# Patient Record
Sex: Female | Born: 1965 | Race: Black or African American | Hispanic: No | Marital: Married | Smoking: Never smoker
Health system: Southern US, Community
[De-identification: ages and names within clinical notes are randomized; demographics above are authoritative.]

## PROBLEM LIST (undated history)

## (undated) DIAGNOSIS — M5136 Other intervertebral disc degeneration, lumbar region: Secondary | ICD-10-CM

## (undated) DIAGNOSIS — G43909 Migraine, unspecified, not intractable, without status migrainosus: Secondary | ICD-10-CM

## (undated) DIAGNOSIS — M51369 Other intervertebral disc degeneration, lumbar region without mention of lumbar back pain or lower extremity pain: Secondary | ICD-10-CM

## (undated) HISTORY — PX: TONSILLECTOMY: SUR1361

## (undated) HISTORY — PX: GASTRECTOMY: SHX58

## (undated) HISTORY — PX: HERNIA REPAIR: SHX51

---

## 2000-02-27 ENCOUNTER — Inpatient Hospital Stay (HOSPITAL_COMMUNITY): Admission: AD | Admit: 2000-02-27 | Discharge: 2000-03-01 | Payer: Self-pay | Admitting: Obstetrics and Gynecology

## 2000-03-02 ENCOUNTER — Encounter: Admission: RE | Admit: 2000-03-02 | Discharge: 2000-04-23 | Payer: Self-pay | Admitting: Obstetrics and Gynecology

## 2000-04-03 ENCOUNTER — Other Ambulatory Visit: Admission: RE | Admit: 2000-04-03 | Discharge: 2000-04-03 | Payer: Self-pay | Admitting: Obstetrics and Gynecology

## 2001-04-28 ENCOUNTER — Other Ambulatory Visit: Admission: RE | Admit: 2001-04-28 | Discharge: 2001-04-28 | Payer: Self-pay | Admitting: Obstetrics and Gynecology

## 2002-05-06 ENCOUNTER — Other Ambulatory Visit: Admission: RE | Admit: 2002-05-06 | Discharge: 2002-05-06 | Payer: Self-pay | Admitting: Obstetrics and Gynecology

## 2003-06-01 ENCOUNTER — Other Ambulatory Visit: Admission: RE | Admit: 2003-06-01 | Discharge: 2003-06-01 | Payer: Self-pay | Admitting: Obstetrics and Gynecology

## 2013-12-21 DIAGNOSIS — G4733 Obstructive sleep apnea (adult) (pediatric): Secondary | ICD-10-CM | POA: Insufficient documentation

## 2016-05-18 DIAGNOSIS — M6240 Contracture of muscle, unspecified site: Secondary | ICD-10-CM | POA: Insufficient documentation

## 2016-05-18 DIAGNOSIS — M722 Plantar fascial fibromatosis: Secondary | ICD-10-CM | POA: Insufficient documentation

## 2016-07-27 DIAGNOSIS — K219 Gastro-esophageal reflux disease without esophagitis: Secondary | ICD-10-CM | POA: Insufficient documentation

## 2016-07-27 DIAGNOSIS — K581 Irritable bowel syndrome with constipation: Secondary | ICD-10-CM | POA: Insufficient documentation

## 2017-08-14 ENCOUNTER — Encounter: Payer: Self-pay | Admitting: Emergency Medicine

## 2017-08-14 ENCOUNTER — Emergency Department
Admission: EM | Admit: 2017-08-14 | Discharge: 2017-08-14 | Disposition: A | Discharge status: Home / Self Care | Payer: BC Managed Care – PPO | Source: Home / Self Care | Attending: Family Medicine | Admitting: Family Medicine

## 2017-08-14 DIAGNOSIS — M546 Pain in thoracic spine: Secondary | ICD-10-CM

## 2017-08-14 DIAGNOSIS — M545 Low back pain, unspecified: Secondary | ICD-10-CM

## 2017-08-14 HISTORY — DX: Migraine, unspecified, not intractable, without status migrainosus: G43.909

## 2017-08-14 HISTORY — DX: Other intervertebral disc degeneration, lumbar region without mention of lumbar back pain or lower extremity pain: M51.369

## 2017-08-14 HISTORY — DX: Other intervertebral disc degeneration, lumbar region: M51.36

## 2017-08-14 MED ORDER — PREDNISONE 20 MG PO TABS: ORAL_TABLET | ORAL | 0 refills | Status: DC

## 2017-08-14 MED ORDER — METHOCARBAMOL 500 MG PO TABS: 500.0000 mg | ORAL_TABLET | Freq: Two times a day (BID) | ORAL | 0 refills | Status: DC

## 2017-08-14 NOTE — ED Triage Notes (Signed)
Chronic low back pain, DDD, 10 days ago LBP on right, then left now in the center of her back.

## 2017-08-14 NOTE — ED Provider Notes (Signed)
[facility]    CSN: [id number] Arrival date & time: 08/14/17  1857     History   Chief Complaint Chief Complaint  Patient presents with  . Back Pain    HPI Diane Cortez is a 51 y.o. female.   HPI  Diane Cortez is a 51 y.o. female presenting to UC with c/o exacerbation of her chronic low back pain.  She has a hx of DDD but does not usually have issues with her back.  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  Denies specific known injury.  Pain has moved from Right lower back to Left lower back to mid back over the last 10 days.  Pain is aching and sore, 7/10, worse with certain movements.  She has tried OTC icyhot, ibuprofen, warm and cool compresses w/o much relief.  She has been on prednisone and muscle relaxers in the past. Denies fever or chills. No urinary symptoms. Denies change in bowel or bladder habits. No radiation of pain or numbness in arms or legs.     Past Medical History:  Diagnosis Date  . DDD (degenerative disc disease), lumbar   . Migraines     There are no active problems to display for this patient.   Past Surgical History:  Procedure Laterality Date  . HERNIA REPAIR    . TONSILLECTOMY      OB History    No data available       Home Medications    Prior to Admission medications   Medication Sig Start Date End Date Taking? Authorizing Provider  lansoprazole (PREVACID) 30 MG capsule Take 30 mg by mouth daily at 12 noon.   Yes [provider]  Liraglutide -Weight Management (SAXENDA) 18 MG/3ML SOPN Inject into the skin.   Yes [provider]  magnesium sulfate (EPSOM SALT) GRAN Take by mouth as needed for mild constipation.   Yes [provider]  Topiramate ER (QUDEXY XR) 100 MG CS24 sprinkle capsule Take by mouth.   Yes [provider]  methocarbamol (ROBAXIN) 500 MG tablet Take 1 tablet (500 mg total) by mouth 2 (two) times daily. 08/14/17    Lurene Shadow, PA-C  predniSONE (DELTASONE) 20 MG tablet 3 tabs po day one, then 2 po daily x 4 days 08/14/17   Lurene Shadow, PA-C    Family History No family history on file.  Social History Social History  Substance Use Topics  . Smoking status: Never Smoker  . Smokeless tobacco: Never Used  . Alcohol use No     Allergies   Ivp dye [iodinated diagnostic agents]   Review of Systems Review of Systems  Constitutional: Negative for chills and fever.  Gastrointestinal: Negative for abdominal pain, nausea and vomiting.  Genitourinary: Negative for dysuria, flank pain, frequency and hematuria.  Musculoskeletal: Positive for back pain and myalgias. Negative for arthralgias.  Skin: Negative for rash.  Neurological: Negative for weakness and numbness.     Physical Exam Triage Vital Signs ED Triage Vitals  Enc Vitals Group     BP 08/14/17 1915 125/82     Pulse Rate 08/14/17 1915 78     Resp --      Temp 08/14/17 1915 97.8 F (36.6 C)     Temp Source 08/14/17 1915 Oral     SpO2 08/14/17 1915 100 %     Weight 08/14/17 1916 233 lb (105.7 kg)     Height 08/14/17 1916   (1.549 m)     Head Circumference --      Peak Flow --      Pain Score 08/14/17 1916 7     Pain Loc --      Pain Edu? --      Excl. in GC? --    No data found.   Updated Vital Signs BP 125/82 (BP Location: Left Arm)   Pulse 78   Temp 97.8 F (36.6 C) (Oral)   Ht  (1.549 m)   Wt 233 lb (105.7 kg)   SpO2 100%   BMI 44.02 kg/m   Visual Acuity Right Eye Distance:   Left Eye Distance:   Bilateral Distance:    Right Eye Near:   Left Eye Near:    Bilateral Near:     Physical Exam  Constitutional: She is oriented to person, place, and time. She appears well-developed and well-nourished. No distress.  HENT:  Head: Normocephalic and atraumatic.  Eyes: EOM are normal.  Neck: Normal range of motion.  Cardiovascular: Normal rate.   Pulmonary/Chest: Effort normal. No respiratory  distress.  Musculoskeletal: Normal range of motion. She exhibits tenderness. She exhibits no edema.  No midline spinal tenderness. Tenderness to Left and Right paraspinal muscles of mid thoracic to lower lumbar region. Full ROM upper and lower extremities.    Neurological: She is alert and oriented to person, place, and time.  Antalgic gait that slowly improves to normal gait while walking.   Skin: Skin is warm and dry. No rash noted. She is not diaphoretic. No erythema.  Psychiatric: She has a normal mood and affect. Her behavior is normal.  Nursing note and vitals reviewed.    UC Treatments / Results  Labs (all labs ordered are listed, but only abnormal results are displayed) Labs Reviewed - No data to display  EKG  EKG Interpretation None       Radiology No results found.  Procedures Procedures (including critical care time)  Medications Ordered in UC Medications - No data to display   Initial Impression / Assessment and Plan / UC Course  I have reviewed the triage vital signs and the nursing notes.  Pertinent labs & imaging results that were available during my care of the patient were reviewed by me and considered in my medical decision making (see chart for details).     Hx and exam c/w muscle strain in back. No red flag symptoms Will treat conservatively F/u with PCP as needed.   Final Clinical Impressions(s) / UC Diagnoses   Final diagnoses:  Acute midline thoracic back pain  Acute midline low back pain without sciatica    New Prescriptions Discharge Medication List as of 08/14/2017  7:26 PM    START taking these medications   Details  methocarbamol (ROBAXIN) 500 MG tablet Take 1 tablet (500 mg total) by mouth 2 (two) times daily., Starting Wed 08/14/2017, Normal    predniSONE (DELTASONE) 20 MG tablet 3 tabs po day one, then 2 po daily x 4 days, Normal         Controlled Substance Prescriptions [place] Controlled Substance Registry consulted? Not  Applicable   Rolla Plate 08/15/17 1610

## 2017-08-14 NOTE — Discharge Instructions (Signed)
°  You may take 500mg acetaminophen every 4-6 hours or in combination with ibuprofen 400-600mg every 6-8 hours as needed for pain. ° °Robaxin (methocarbamol) is a muscle relaxer and may cause drowsiness. Do not drink alcohol, drive, or operate heavy machinery while taking. ° ° °

## 2017-08-15 ENCOUNTER — Telehealth: Payer: Self-pay | Admitting: *Deleted

## 2017-08-15 NOTE — Telephone Encounter (Signed)
Patient called reporting [facility] did not have any Robaxin in stock. Per patient request I called in Rx to [facility] on [address]

## 2018-12-16 DIAGNOSIS — G43009 Migraine without aura, not intractable, without status migrainosus: Secondary | ICD-10-CM | POA: Insufficient documentation

## 2020-10-08 ENCOUNTER — Emergency Department (INDEPENDENT_AMBULATORY_CARE_PROVIDER_SITE_OTHER): Payer: BC Managed Care – PPO

## 2020-10-08 ENCOUNTER — Encounter: Payer: Self-pay | Admitting: Emergency Medicine

## 2020-10-08 ENCOUNTER — Other Ambulatory Visit: Payer: Self-pay

## 2020-10-08 ENCOUNTER — Emergency Department
Admission: EM | Admit: 2020-10-08 | Discharge: 2020-10-08 | Disposition: A | Discharge status: Home / Self Care | Payer: BC Managed Care – PPO | Source: Home / Self Care

## 2020-10-08 DIAGNOSIS — M542 Cervicalgia: Secondary | ICD-10-CM

## 2020-10-08 DIAGNOSIS — S161XXA Strain of muscle, fascia and tendon at neck level, initial encounter: Secondary | ICD-10-CM

## 2020-10-08 MED ORDER — KETOROLAC TROMETHAMINE 60 MG/2ML IM SOLN
60.0000 mg | Freq: Once | INTRAMUSCULAR | Status: AC
  Administered 2020-10-08: 60 mg via INTRAMUSCULAR

## 2020-10-08 MED ORDER — DEXAMETHASONE SODIUM PHOSPHATE 10 MG/ML IJ SOLN
10.0000 mg | Freq: Once | INTRAMUSCULAR | Status: AC
  Administered 2020-10-08: 10 mg via INTRAMUSCULAR

## 2020-10-08 MED ORDER — METHOCARBAMOL 750 MG PO TABS: 750.0000 mg | ORAL_TABLET | Freq: Three times a day (TID) | ORAL | 0 refills | Status: DC | PRN

## 2020-10-08 MED ORDER — PREDNISONE 20 MG PO TABS: 20.0000 mg | ORAL_TABLET | Freq: Every day | ORAL | 0 refills | Status: DC

## 2020-10-08 NOTE — ED Triage Notes (Signed)
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

## 2020-10-08 NOTE — ED Provider Notes (Signed)
[name]    CSN: [id number] Arrival date & time: 10/08/20  0844      History   Chief Complaint Chief Complaint  Patient presents with  . Neck Pain    HPI Diane Cortez is a 54 y.o. female.   HPI  Patient complains of gradually worsening generalized neck pain with radiation of pain into her left arm causing intermittent numbness and tingling in index finger. She is unable to rotate neck, hyperextend and or flex neck. She endorse bilateral jaw pain with chewing. She is also experiencing a generalized headache. Medical history significant DDD disease. No active cardiovascular disease. Patient denies chest pain or dizziness. No known injury involving the shoulder. Taken Meloxicam, tylenol,and naproxen without relief. She has also applied heat with temporary relief of pain Past Medical History:  Diagnosis Date  . DDD (degenerative disc disease), lumbar   . Migraines     Patient Active Problem List   Diagnosis Date Noted  . Migraine without aura and without status migrainosus, not intractable 12/16/2018  . Gastro-esophageal reflux disease without esophagitis 07/27/2016  . Irritable bowel syndrome with constipation 07/27/2016  . Contracture of tendon sheath 05/18/2016  . Plantar fasciitis 05/18/2016  . OSA (obstructive sleep apnea) 12/21/2013    Past Surgical History:  Procedure Laterality Date  . HERNIA REPAIR    . TONSILLECTOMY      OB History   No obstetric history on file.      Home Medications    Prior to Admission medications   Medication Sig Start Date End Date Taking? Authorizing Provider  dicyclomine (BENTYL) 10 MG capsule Take by mouth. 06/03/20  Yes [provider]  lansoprazole (PREVACID) 30 MG capsule Take 30 mg by mouth daily at 12 noon.   Yes [provider]  magnesium oxide (MAG-OX) 400 MG tablet Take by mouth.   Yes [provider]  meloxicam (MOBIC) 15 MG tablet Take by mouth. 07/18/20 07/18/21 Yes [provider]  almotriptan (AXERT) 12.5 MG tablet Take by mouth. 10/17/17 10/16/20  [provider]  Liraglutide -Weight Management (SAXENDA) 18 MG/3ML SOPN Inject into the skin.    [provider]  magnesium sulfate (EPSOM SALT) GRAN Take by mouth as needed for mild constipation.    [provider]  methocarbamol (ROBAXIN) 500 MG tablet Take 1 tablet (500 mg total) by mouth 2 (two) times daily. Patient not taking: Reported on 10/08/2020 08/14/17   Lurene Shadow, PA-C  predniSONE (DELTASONE) 20 MG tablet 3 tabs po day one, then 2 po daily x 4 days Patient not taking: Reported on 10/08/2020 08/14/17   Lurene Shadow, PA-C  sucralfate (CARAFATE) 1 GM/10ML suspension as needed. 10/16/17   [provider]  Topiramate ER (QUDEXY XR) 100 MG CS24 sprinkle capsule Take by mouth.    [provider]  WEGOVY 2.4 MG/0.75ML SOAJ  09/14/20   [provider]    Family History Family History  Problem Relation Age of Onset  . Diabetes Mother   . Cancer Father   . Diabetes Sister   . Diabetes Brother   . Healthy Sister     Social History Social History   Tobacco Use  . Smoking status: Never Smoker  . Smokeless tobacco: Never Used  Substance Use Topics  . Alcohol use: No  . Drug use: No     Allergies   Hydrocodone-acetaminophen and Ivp dye [iodinated diagnostic agents]   Review of Systems Review of Systems Pertinent negatives listed in  HPI  Physical Exam Triage Vital Signs ED Triage Vitals  Enc Vitals Group     BP 10/08/20 0857 110/80     Pulse Rate 10/08/20 0857 84     Resp 10/08/20 0857 16     Temp 10/08/20 0857 99 F (37.2 C)     Temp Source 10/08/20 0857 Oral     SpO2 10/08/20 0857 99 %     Weight 10/08/20 0903 245 lb (111.1 kg)     Height 10/08/20 0903 5\' 1"  (1.549 m)     Head Circumference --      Peak Flow --      Pain Score 10/08/20 0903 8     Pain Loc --      Pain Edu? --      Excl. in GC? --    No data  found.  Updated Vital Signs BP 110/80 (BP Location: Right Arm)   Pulse 84   Temp 99 F (37.2 C) (Oral)   Resp 16   Ht 5\' 1"  (1.549 m)   Wt 245 lb (111.1 kg)   SpO2 99%   BMI 46.29 kg/m   Visual Acuity Right Eye Distance:   Left Eye Distance:   Bilateral Distance:    Right Eye Near:   Left Eye Near:    Bilateral Near:     Physical Exam General appearance: alert, well developed, well nourished, cooperative and in no distress Head: Normocephalic, without obvious abnormality, atraumatic Respiratory: Respirations even and unlabored, normal respiratory rate Heart: rate and rhythm normal. No gallop or murmurs noted on exam  Cervical neck: Stiffness, tenderness (palpable), decreases ROM, no mass, no spasm. Skin: Skin color, texture, turgor normal. No rashes seen  Psych: Appropriate mood and affect. Neurologic: Mental status: Alert, oriented to person, place, and time,   UC Treatments / Results  Labs (all labs ordered are listed, but only abnormal results are displayed) Labs Reviewed - No data to display  EKG   Radiology No results found.  Procedures Procedures (including critical care time)  Medications Ordered in UC Medications - No data to display  Initial Impression / Assessment and Plan / UC Course  I have reviewed the triage vital signs and the nursing notes.  Pertinent labs & imaging results that were available during my care of the patient were reviewed by me and considered in my medical decision making (see chart for details).    Acute cervical neck strain related to degenerative disc disease confirmed via x-ray here in clinic today.  Will treat with a prednisone taper, muscle relaxers, and continue home meloxicam.  Patient given a Toradol and Decadron injection here in clinic today.  She was start oral prednisone course on tomorrow.  She will follow up with sports medicine if symptoms recur. Final Clinical Impressions(s) / UC Diagnoses   Final diagnoses:   Acute strain of neck muscle, initial encounter     Discharge Instructions     Your x-ray shows chronic neck pain. I am starting you on prednisone taper (start tomorrow 10/09/20)prescribing muscle relaxer and continue Meloxicam.    ED Prescriptions    Medication Sig Dispense Auth. Provider   predniSONE (DELTASONE) 20 MG tablet Take 1 tablet (20 mg total) by mouth daily with breakfast. Prednisone 20 mg, in mornings with breakfast as follows: Take 3 pills for 3 days, take 2 pills for 3 days, and take 1 pill for 3 days. 18 tablet , FNP   methocarbamol (ROBAXIN-750) 750 MG tablet Take 1  tablet (750 mg total) by mouth 3 (three) times daily as needed for muscle spasms. 30 tablet Bing Neighbors, FNP     PDMP not reviewed this encounter.   Bing Neighbors, FNP 10/08/20 1042

## 2020-10-08 NOTE — Discharge Instructions (Addendum)
Your x-ray shows chronic neck pain. I am starting you on prednisone taper (start tomorrow 10/09/20)prescribing muscle relaxer and continue Meloxicam.

## 2020-11-02 ENCOUNTER — Ambulatory Visit: Payer: BC Managed Care – PPO | Admitting: Family Medicine

## 2020-11-03 ENCOUNTER — Ambulatory Visit (INDEPENDENT_AMBULATORY_CARE_PROVIDER_SITE_OTHER): Payer: BC Managed Care – PPO | Admitting: Family Medicine

## 2020-11-03 ENCOUNTER — Other Ambulatory Visit: Payer: Self-pay

## 2020-11-03 VITALS — BP 110/70 | Ht 61.0 in | Wt 244.0 lb

## 2020-11-03 DIAGNOSIS — M62838 Other muscle spasm: Secondary | ICD-10-CM | POA: Insufficient documentation

## 2020-11-03 MED ORDER — BACLOFEN 10 MG PO TABS: 5.0000 mg | ORAL_TABLET | Freq: Two times a day (BID) | ORAL | 1 refills | Status: DC | PRN

## 2020-11-03 NOTE — Assessment & Plan Note (Signed)
Symptoms seem more muscular in nature.  X-ray was reassuring.  Seems less likely for nerve impingement. -Counseled on home exercise therapy and supportive care -Baclofen. -Could consider physical therapy. -Could consider trigger point injections.

## 2020-11-03 NOTE — Progress Notes (Signed)
  Diane Cortez - 54 y.o. female MRN [id number]  Date of birth: 12-27-1965  SUBJECTIVE:  Including CC & ROS.  No chief complaint on file.   Diane Cortez is a 54 y.o. female that is presenting with neck pain.  She did get initial improvement after being seen in the emergency department.  She is having posterior neck pain.  Denies any specific inciting event.  No history of similar pain.  No injury.  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  No radicular symptoms..  Independent review of the cervical spine x-ray from 12/4 shows no acute changes.   Review of Systems See HPI   HISTORY: Past Medical, Surgical, Social, and Family History Reviewed & Updated per EMR.   Pertinent Historical Findings include:  Past Medical History:  Diagnosis Date  . DDD (degenerative disc disease), lumbar   . Migraines     Past Surgical History:  Procedure Laterality Date  . HERNIA REPAIR    . TONSILLECTOMY      Family History  Problem Relation Age of Onset  . Diabetes Mother   . Cancer Father   . Diabetes Sister   . Diabetes Brother   . Healthy Sister     Social History   Socioeconomic History  . Marital status: Married    Spouse name: Not on file  . Number of children: Not on file  . Years of education: Not on file  . Highest education level: Not on file  Occupational History  . Not on file  Tobacco Use  . Smoking status: Never Smoker  . Smokeless tobacco: Never Used  Substance and Sexual Activity  . Alcohol use: No  . Drug use: No  . Sexual activity: Not on file  Other Topics Concern  . Not on file  Social History Narrative  . Not on file   Social Determinants of Health   Financial Resource Strain: Not on file  Food Insecurity: Not on file  Transportation Needs: Not on file  Physical Activity: Not on file  Stress: Not on file  Social Connections: Not on file  Intimate Partner Violence: Not on file     PHYSICAL EXAM:  VS: BP 110/70   Ht 5\' 1"  (1.549 m)   Wt 244 lb  (110.7 kg)   BMI 46.10 kg/m  Physical Exam Gen: NAD, alert, cooperative with exam, well-appearing MSK:  Neck: Limited extension. Normal lateral rotation. Normal flexion. Normal grip strength. Normal pincer grasp. Neurovascularly intact     ASSESSMENT & PLAN:   Neck muscle spasm Symptoms seem more muscular in nature.  X-ray was reassuring.  Seems less likely for nerve impingement. -Counseled on home exercise therapy and supportive care -Baclofen. -Could consider physical therapy. -Could consider trigger point injections.

## 2020-11-03 NOTE — Patient Instructions (Signed)
Nice to meet you Please try heat  Please try the exercises   Please try the baclofen. Do not take with robaxin or flexeril  Let me know about physical therapy  Please send me a message in MyChart with any questions or updates.  Please see me back in 4 weeks.   --Dr. Jordan Likes

## 2020-12-08 ENCOUNTER — Ambulatory Visit: Payer: BC Managed Care – PPO | Admitting: Family Medicine

## 2020-12-08 NOTE — Progress Notes (Deleted)
  Diane Cortez - 55 y.o. female MRN [id number]  Date of birth: 1965/11/27  SUBJECTIVE:  Including CC & ROS.  No chief complaint on file.   Diane Cortez is a 54 y.o. female that is  ***.  ***   Review of Systems See HPI   HISTORY: Past Medical, Surgical, Social, and Family History Reviewed & Updated per EMR.   Pertinent Historical Findings include:  Past Medical History:  Diagnosis Date  . DDD (degenerative disc disease), lumbar   . Migraines     Past Surgical History:  Procedure Laterality Date  . HERNIA REPAIR    . TONSILLECTOMY      Family History  Problem Relation Age of Onset  . Diabetes Mother   . Cancer Father   . Diabetes Sister   . Diabetes Brother   . Healthy Sister     Social History   Socioeconomic History  . Marital status: Married    Spouse name: Not on file  . Number of children: Not on file  . Years of education: Not on file  . Highest education level: Not on file  Occupational History  . Not on file  Tobacco Use  . Smoking status: Never Smoker  . Smokeless tobacco: Never Used  Substance and Sexual Activity  . Alcohol use: No  . Drug use: No  . Sexual activity: Not on file  Other Topics Concern  . Not on file  Social History Narrative  . Not on file   Social Determinants of Health   Financial Resource Strain: Not on file  Food Insecurity: Not on file  Transportation Needs: Not on file  Physical Activity: Not on file  Stress: Not on file  Social Connections: Not on file  Intimate Partner Violence: Not on file     PHYSICAL EXAM:  VS: There were no vitals taken for this visit. Physical Exam Gen: NAD, alert, cooperative with exam, well-appearing MSK:  ***      ASSESSMENT & PLAN:   No problem-specific Assessment & Plan notes found for this encounter.

## 2020-12-09 ENCOUNTER — Telehealth (INDEPENDENT_AMBULATORY_CARE_PROVIDER_SITE_OTHER): Payer: BC Managed Care – PPO | Admitting: Family Medicine

## 2020-12-09 DIAGNOSIS — M62838 Other muscle spasm: Secondary | ICD-10-CM

## 2020-12-09 NOTE — Progress Notes (Signed)
Virtual Visit via Video Note  I connected with Diane Cortez on 12/09/20 at  8:50 AM EST by a video enabled telemedicine application and verified that I am speaking with the correct person using two identifiers.  Location: Patient: home Provider: home   I discussed the limitations of evaluation and management by telemedicine and the availability of in person appointments. The patient expressed understanding and agreed to proceed.  History of Present Illness:  Ms. Diane Cortez is a 55 yo F that is following up for her ongoing neck pain. Has tried baclofen but pain still ongoing. Started taking mobic. She feels significant pain when it flares. Has tried a soft cervical collar as well.    Observations/Objective:  Gen: NAD, alert, cooperative with exam, well-appearing  Assessment and Plan:  Neck muscle spasm:  Still having intermittent ongoing pain. Has some radiation that wraps around to the jaw. No history of teeth grinding or chicken pox.  - counseled on home exercise therapy and supportive care - referral to PT - could consider trigger point injections or further imaging.   Follow Up Instructions:    I discussed the assessment and treatment plan with the patient. The patient was provided an opportunity to ask questions and all were answered. The patient agreed with the plan and demonstrated an understanding of the instructions.   The patient was advised to call back or seek an in-person evaluation if the symptoms worsen or if the condition fails to improve as anticipated.   Diane Gandy, MD

## 2020-12-09 NOTE — Assessment & Plan Note (Signed)
Still having intermittent ongoing pain. Has some radiation that wraps around to the jaw. No history of teeth grinding or chicken pox.  - counseled on home exercise therapy and supportive care - referral to PT - could consider trigger point injections or further imaging.

## 2020-12-19 ENCOUNTER — Other Ambulatory Visit: Payer: Self-pay

## 2020-12-19 ENCOUNTER — Encounter: Payer: Self-pay | Admitting: Physical Therapy

## 2020-12-19 ENCOUNTER — Ambulatory Visit (INDEPENDENT_AMBULATORY_CARE_PROVIDER_SITE_OTHER): Payer: BC Managed Care – PPO | Admitting: Physical Therapy

## 2020-12-19 DIAGNOSIS — M62838 Other muscle spasm: Secondary | ICD-10-CM | POA: Diagnosis not present

## 2020-12-19 DIAGNOSIS — M542 Cervicalgia: Secondary | ICD-10-CM

## 2020-12-19 DIAGNOSIS — M6281 Muscle weakness (generalized): Secondary | ICD-10-CM | POA: Diagnosis not present

## 2020-12-19 DIAGNOSIS — R293 Abnormal posture: Secondary | ICD-10-CM

## 2020-12-19 NOTE — Patient Instructions (Signed)
Access Code: [email or web address] URL: [email or web address] Date: 12/19/2020 Prepared by: Roderic Scarce  Exercises Standing Backward Shoulder Rolls - 7-8 x daily - 5-10 reps Standing Scapular Retraction - 7-8 x daily - 5-10 reps Shoulder External Rotation and Scapular Retraction - 7-8 x daily - 5-10 reps Standing Cervical Retraction - 7-8 x daily - 5-10 reps Doorway Pec Stretch at 60 Elevation - 1 x daily - 3 reps - 30 hold Single Arm Doorway Pec Stretch at 90 Degrees Abduction - 1 x daily - 3 reps - 30 hold

## 2020-12-19 NOTE — Therapy (Signed)
[facility] [place] [address]  [address] [place], [place], [address] Phone: [phone]   Fax:  [phone]  Physical Therapy Evaluation  Patient Details  Name: Diane Cortez MRN: [id number] Date of Birth: February 07, 1966 Referring Provider (PT): Dr Clare Gandy   Encounter Date: 12/19/2020   PT End of Session - 12/19/20 1059    Visit Number 1    Number of Visits 6    Date for PT Re-Evaluation 01/30/21    Authorization Type BCBS    PT Start Time 1059    PT Stop Time 1150    PT Time Calculation (min) 51 min    Activity Tolerance Patient tolerated treatment well    Behavior During Therapy [facility] for tasks assessed/performed           Past Medical History:  Diagnosis Date  . DDD (degenerative disc disease), lumbar   . Migraines     Past Surgical History:  Procedure Laterality Date  . HERNIA REPAIR    . TONSILLECTOMY      There were no vitals filed for this visit.    Subjective Assessment - 12/19/20 1059    Subjective Pt reports she developed neck pain around Thanksgiving, no episode of injury.  She has tried heat, soft coller, pain meds, gels, changing pillows.  Had two injections in Dec, a steriod pack and muscle relaxer at that time.    Pertinent History plantar fascitis in Rt foot, decreased tightness of tensons in ankle - wears a stabilizing brace to put off having surgery. Has a rowing machine and attempts to use this periodically but it has bothered her lower body, thinks the neck and shoulder were ok with it.    Diagnostic tests xray - mild degeneratvie changes C3-4    Currently in Pain? No/denies   no pain today, she has episodes that have occurred several times since Thanksgiving.  She will be in extreme pain for 6-7 days   Pain Radiating Towards into jaw, down into her Rt thumb sometimes and into the Lt foot.  Her pain is random and interferes with her sleep.    Aggravating Factors  unknown    Pain Relieving Factors using  coller, gentle movment.              [facility] PT Assessment - 12/19/20 0001      Assessment   Medical Diagnosis Neck spasms    Referring Provider (PT) Dr Clare Gandy    Onset Date/Surgical Date 10/08/20    Hand Dominance Right    Next MD Visit PRN    Prior Therapy a long time ago for her back      Precautions   Precautions None      Balance Screen   Has the patient fallen in the past 6 months No    Has the patient had a decrease in activity level because of a fear of falling?  No    Is the patient reluctant to leave their home because of a fear of falling?  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 Popping in Lt shoulder with elevation, behind back just below bra strap   Cervical Flexion 28 with pain    Cervical Extension 56 slight pain    Cervical - Right Side Bend 24, pain Rt side of neck    Cervical - Left Side Bend 42    Cervical - Right Rotation 68    Cervical - Left Rotation 52 with pain      Strength   Strength Assessment Site Shoulder;Elbow;Hand    Right/Left Shoulder --   Rt 4/5,Lt 4+/5    Right/Left Elbow --   Rt 4+/5, Lt 5/5   Right/Left hand --   grip Rt 43/45/47, Lt 38/40/38     Flexibility   Soft Tissue Assessment /Muscle Length --   tight pecs bilat     Palpation   Spinal mobility hypomobile through cervical spine and thoracic to T8    Palpation comment very tight and tender in Rt upper trap, levator and pericervical muscles, some tightness in the Lt side however not tender.      Special Tests   Other special tests (-) spurlings                      Objective measurements completed on examination: See above findings.       [facility] Adult PT Treatment/Exercise - 12/19/20 0001      Exercises   Exercises Neck      Neck Exercises: Seated   Neck Retraction 10 reps    Shoulder Rolls Backwards;10 reps    Other Seated Exercise 10 reps scapular retraction arms at sides and into ER      Neck Exercises: Stretches   Chest Stretch 1 rep;30 seconds    Chest Stretch Limitations doorway low and mid                  PT Education - 12/19/20 1302    Education Details HEP, POC and FOTO    Person(s) Educated Patient    Methods Explanation;Demonstration;Handout    Comprehension Returned demonstration;Verbalized understanding               PT Long Term Goals - 12/19/20 1309      PT LONG TERM GOAL #1   Title I with advanced HEP for postural correction work and use of rowing machine at home for general wellness    Time 6    Period Weeks    Status New    Target Date 01/30/21      PT LONG TERM GOAL #2   Title improve FOTO =/better than 66    Time 6    Period Weeks    Status New    Target Date 01/30/21      PT LONG TERM GOAL #3   Title demo painfree cervical ROM WFL to allow her to perform normal acitivites without difficulty    Time 6  Period Weeks    Status New    Target Date 01/30/21      PT LONG TERM GOAL #4   Title improve Rt UE strength to = LT    Time 6    Period Weeks    Status New    Target Date 01/30/21      PT LONG  TERM GOAL #5   Title report no new episodes of pain flare ups for 3 wks or more    Period Weeks    Status New    Target Date 01/30/21                  Plan - 12/19/20 1303    Clinical Impression Statement 55 yo female with intermittent neck pain with radiculopathy into her hands that started in Dec of last year.  Her most frequent episode was 12/09/2020.  She reports she will have flare ups from unknown etiology that last about 6-7 days.  She manages the episodes with medication, wearing a soft collar, heat, massage and gently motion.  At the time of this eval she is painfree at rest.  She does have limited cervical ROM with pain at endrange, weakness in her Rt shoulder and upper back, postural changes, hypomobility throughout her cervical spine and upper to mid thoracic and alot of tighness in the Rt upper shoulder and neck.  She would benefit from PT to address these deficits and educate on exercise to prevent reoccurrence or help her manage through them quicker.    Personal Factors and Comorbidities Comorbidity 3+    Comorbidities refer to snapshot    Examination-Activity Limitations Other    Examination-Participation Restrictions Other    Stability/Clinical Decision Making Stable/Uncomplicated    Clinical Decision Making Low    Rehab Potential Good    PT Frequency 1x / week   per pt request as she has a high co-pay for therapy   PT Duration 6 weeks    PT Treatment/Interventions Iontophoresis 4mg /ml Dexamethasone;Taping;Vasopneumatic Device;Patient/family education;Functional mobility training;Moist Heat;Traction;Therapeutic activities;Passive range of motion;Dry needling;Manual techniques;Neuromuscular re-education;Electrical Stimulation;Cryotherapy    PT Next Visit Plan manual work to Rt upper shoulder/cervical region, possible DN.  Postural correction work and progress HEP as she is only able to attend once a week.  Modalities PRN    PT Home Exercise Plan Access Code:     Consulted and Agree with Plan of Care Patient           Patient will benefit from skilled therapeutic intervention in order to improve the following deficits and impairments:  Pain,Postural dysfunction,Decreased strength,Hypomobility,Increased muscle spasms,Impaired UE functional use,Decreased range of motion  Visit Diagnosis: Cervicalgia - Plan: PT plan of care cert/re-cert  Muscle weakness (generalized) - Plan: PT plan of care cert/re-cert  Abnormal posture - Plan: PT plan of care cert/re-cert  Other muscle spasm - Plan: PT plan of care cert/re-cert     Problem List Patient Active Problem List   Diagnosis Date Noted  . Neck muscle spasm 11/03/2020  . Migraine without aura and without status migrainosus, not intractable 12/16/2018  . Gastro-esophageal reflux disease without esophagitis 07/27/2016  . Irritable bowel syndrome with constipation 07/27/2016  . Contracture of tendon sheath 05/18/2016  . Plantar fasciitis 05/18/2016  . OSA (obstructive sleep apnea) 12/21/2013    12/23/2013 rPT  12/19/2020, 1:14 PM  [facility] [address] [place] [address] [place], [place], [place] Phone: [phone]   Fax:  [phone]  Name: Diane Cortez MRN:  [id number] Date of Birth: April 24, 1966

## 2020-12-22 ENCOUNTER — Ambulatory Visit: Payer: BC Managed Care – PPO | Admitting: Physical Therapy

## 2020-12-28 ENCOUNTER — Other Ambulatory Visit: Payer: Self-pay

## 2020-12-28 ENCOUNTER — Ambulatory Visit: Payer: BC Managed Care – PPO | Admitting: Physical Therapy

## 2020-12-28 DIAGNOSIS — R293 Abnormal posture: Secondary | ICD-10-CM | POA: Diagnosis not present

## 2020-12-28 DIAGNOSIS — M542 Cervicalgia: Secondary | ICD-10-CM

## 2020-12-28 DIAGNOSIS — M6281 Muscle weakness (generalized): Secondary | ICD-10-CM | POA: Diagnosis not present

## 2020-12-28 DIAGNOSIS — M62838 Other muscle spasm: Secondary | ICD-10-CM

## 2020-12-28 NOTE — Patient Instructions (Signed)
Access Code: [email or web address] URL: [email or web address] Date: 12/28/2020 Prepared by: Reggy Eye  Exercises Standing Backward Shoulder Rolls - 7-8 x daily - 5-10 reps Shoulder External Rotation and Scapular Retraction - 7-8 x daily - 5-10 reps Standing Cervical Retraction - 7-8 x daily - 5-10 reps Doorway Pec Stretch at 60 Elevation - 1 x daily - 3 reps - 30 hold Single Arm Doorway Pec Stretch at 90 Degrees Abduction - 1 x daily - 3 reps - 30 hold Standing Shoulder Horizontal Abduction with Resistance - 1 x daily - 7 x weekly - 3 sets - 10 reps Standing Bilateral Low Shoulder Row with Anchored Resistance - 1 x daily - 7 x weekly - 3 sets - 10 reps

## 2020-12-28 NOTE — Therapy (Signed)
[facility] [place] [address] [place] [address] [place], [place], [address] Phone: [phone]   Fax:  [phone]  Physical Therapy Treatment  Patient Details  Name: Diane Cortez MRN: [id number] Date of Birth: 1966/02/02 Referring Provider (PT): Dr Clare Gandy   Encounter Date: 12/28/2020   PT End of Session - 12/28/20 1227    Visit Number 2    Number of Visits 6    Date for PT Re-Evaluation 01/30/21    Authorization Type BCBS    PT Start Time 1145    PT Stop Time 1227    PT Time Calculation (min) 42 min    Activity Tolerance Patient tolerated treatment well    Behavior During Therapy [facility] for tasks assessed/performed           Past Medical History:  Diagnosis Date  . DDD (degenerative disc disease), lumbar   . Migraines     Past Surgical History:  Procedure Laterality Date  . HERNIA REPAIR    . TONSILLECTOMY      There were no vitals filed for this visit.   Subjective Assessment - 12/28/20 1150    Subjective Pt states that she has been performing HEP, states that she feels "about the same"    Pertinent History plantar fascitis in Rt foot, decreased tightness of tensons in ankle - wears a stabilizing brace to put off having surgery. Has a rowing machine and attempts to use this periodically but it has bothered her lower body, thinks the neck and shoulder were ok with it.    Diagnostic tests xray - mild degeneratvie changes C3-4    Currently in Pain? No/denies                             [facility] Adult PT Treatment/Exercise - 12/28/20 0001      Neck Exercises: Machines for Strengthening   UBE (Upper Arm Bike) 4 min level 2 alt fwd/bkwd      Neck Exercises: Standing   Other Standing Exercises rows x 20 red TB    Other Standing Exercises scapular depression x 20 red TB      Neck Exercises: Seated   Neck Retraction 10 reps    Shoulder Rolls Backwards;10 reps    Other Seated Exercise 10 reps scapular  retraction UEs into ER      Neck Exercises: Supine   Shoulder ABduction Both;20 reps   horizontal abduction   Shoulder Abduction Weights (lbs) red TB      Manual Therapy   Manual Therapy Soft tissue mobilization    Soft tissue mobilization STM upper trap, cervical paraspinals and suboccipitals bilat      Neck Exercises: Stretches   Chest Stretch 30 seconds;1 rep    Chest Stretch Limitations doorway low and mid                  PT Education - 12/28/20 1226    Education Details updated HEP    Person(s) Educated Patient    Methods Explanation;Demonstration;Handout    Comprehension Returned demonstration;Verbalized understanding               PT Long Term Goals - 12/19/20 1309      PT LONG TERM GOAL #1   Title I with advanced HEP for postural correction work and use of rowing machine at home for general wellness    Time 6    Period Weeks    Status New  Target Date 01/30/21      PT LONG TERM GOAL #2   Title improve FOTO =/better than 66    Time 6    Period Weeks    Status New    Target Date 01/30/21      PT LONG TERM GOAL #3   Title demo painfree cervical ROM WFL to allow her to perform normal acitivites without difficulty    Time 6    Period Weeks    Status New    Target Date 01/30/21      PT LONG TERM GOAL #4   Title improve Rt UE strength to = LT    Time 6    Period Weeks    Status New    Target Date 01/30/21      PT LONG TERM GOAL #5   Title report no new episodes of pain flare ups for 3 wks or more    Period Weeks    Status New    Target Date 01/30/21                 Plan - 12/28/20 1227    Clinical Impression Statement Pt continues with decreased cervical ROM, good tolerance of addition of postural exercises. pt with mm spasticity in upper traps bilat which responds well to manual therapy    PT Next Visit Plan progress postural exercises, manual as indicated    PT Home Exercise Plan Access Code: [email or web address]    Consulted and Agree  with Plan of Care Patient           Patient will benefit from skilled therapeutic intervention in order to improve the following deficits and impairments:     Visit Diagnosis: Cervicalgia  Muscle weakness (generalized)  Other muscle spasm  Abnormal posture     Problem List Patient Active Problem List   Diagnosis Date Noted  . Neck muscle spasm 11/03/2020  . Migraine without aura and without status migrainosus, not intractable 12/16/2018  . Gastro-esophageal reflux disease without esophagitis 07/27/2016  . Irritable bowel syndrome with constipation 07/27/2016  . Contracture of tendon sheath 05/18/2016  . Plantar fasciitis 05/18/2016  . OSA (obstructive sleep apnea) 12/21/2013   Diane Cortez, PT  Diane Cortez 12/28/2020, 12:33 PM  [facility] [address] [place] [address] [place], [place], [address] Phone: [phone]   Fax:  [phone]  Name: Diane Cortez MRN: [id number] Date of Birth: December 19, 1965

## 2021-01-03 ENCOUNTER — Other Ambulatory Visit: Payer: Self-pay | Admitting: Family Medicine

## 2021-01-05 ENCOUNTER — Ambulatory Visit (INDEPENDENT_AMBULATORY_CARE_PROVIDER_SITE_OTHER): Payer: BC Managed Care – PPO | Admitting: Rehabilitative and Restorative Service Providers"

## 2021-01-05 ENCOUNTER — Other Ambulatory Visit: Payer: Self-pay

## 2021-01-05 DIAGNOSIS — M6281 Muscle weakness (generalized): Secondary | ICD-10-CM | POA: Diagnosis not present

## 2021-01-05 DIAGNOSIS — R293 Abnormal posture: Secondary | ICD-10-CM

## 2021-01-05 DIAGNOSIS — M62838 Other muscle spasm: Secondary | ICD-10-CM

## 2021-01-05 DIAGNOSIS — M542 Cervicalgia: Secondary | ICD-10-CM | POA: Diagnosis not present

## 2021-01-05 NOTE — Patient Instructions (Signed)
Access Code: [email or web address] URL: [email or web address] Date: 01/05/2021 Prepared by: Margretta Ditty  Exercises Standing Cervical Retraction - 7-8 x daily - 5-10 reps Doorway Pec Stretch at 60 Elevation - 1 x daily - 3 reps - 30 hold Standing Shoulder Horizontal Abduction with Resistance - 1 x daily - 7 x weekly - 3 sets - 10 reps Standing Bilateral Low Shoulder Row with Anchored Resistance - 1 x daily - 7 x weekly - 3 sets - 10 reps Prone Scapular Retraction - 2 x daily - 7 x weekly - 1 sets - 10 reps - 5 seconds hold Seated Upper Trapezius Stretch - 2 x daily - 7 x weekly - 1 sets - 3 reps - 20-30 seconds hold Standing Backward Shoulder Rolls - 7-8 x daily - 5-10 reps

## 2021-01-05 NOTE — Therapy (Signed)
[facility] [place] [address] [place], [place], [address] Phone: [phone]   Fax:  [phone]  Physical Therapy Treatment  Patient Details  Name: Diane Cortez MRN: [id number] Date of Birth: 16-Aug-1966 Referring Provider (PT): Dr Clare Gandy   Encounter Date: 01/05/2021   PT End of Session - 01/05/21 1202    Visit Number 3    Number of Visits 6    Date for PT Re-Evaluation 01/30/21    Authorization Type BCBS    PT Start Time 0300    PT Stop Time 0600    PT Time Calculation (min) 180 min    Activity Tolerance Patient tolerated treatment well    Behavior During Therapy [facility] for tasks assessed/performed           Past Medical History:  Diagnosis Date  . DDD (degenerative disc disease), lumbar   . Migraines     Past Surgical History:  Procedure Laterality Date  . HERNIA REPAIR    . TONSILLECTOMY      There were no vitals filed for this visit.       [facility] PT Assessment - 01/05/21 1148      Assessment   Medical Diagnosis Neck spasms    Referring Provider (PT) Dr Clare Gandy    Onset Date/Surgical Date 10/08/20      AROM   Cervical Flexion 28   no pain today   Cervical Extension 32   no pain today   Cervical - Right Side Bend 30    Cervical - Left Side Bend 28   in SCM on L side   Cervical - Right Rotation 78    Cervical - Left Rotation 60   pain on R side                        [facility] Adult PT Treatment/Exercise - 01/05/21 1148      Exercises   Exercises Neck;Shoulder      Neck Exercises: Machines for Strengthening   UBE (Upper Arm Bike) level 2 2 minutes forward/1 minute back      Neck Exercises: Standing   Other Standing Exercises scapular retraction x 12 reps with 5 seconds holds with pool noodle      Neck Exercises: Seated   Cervical Rotation Right;Left;5 reps    Lateral Flexion Right;Left;5 reps    Shoulder Rolls Backwards    Other Seated Exercise seated upper trap  stretch      Shoulder Exercises: Prone   Retraction Strengthening;Both;10 reps    Retraction Limitations shoulder squeeze, then W retraction      Shoulder Exercises: ROM/Strengthening   Wall Pushups 10 reps    Wall Pushups Limitations end range scapular press      Manual Therapy   Manual Therapy Soft tissue mobilization;Scapular mobilization;Joint mobilization;Manual Traction    Manual therapy comments to reduce pain and improve motion    Joint Mobilization grade II PA mobs thoracic spine and low cervical spine    Soft tissue mobilization bilateral UT, scalenes, and suboccipitals    Scapular Mobilization sidelying R parascapular mobility    Manual Traction supine gentle manual traction                  PT Education - 01/05/21 1240    Education Details modified 2 exercises to add upper trap stretching and prone scapular strengthening    Person(s) Educated Patient    Methods Explanation;Demonstration;Handout    Comprehension Verbalized understanding;Returned  demonstration               PT Long Term Goals - 12/19/20 1309      PT LONG TERM GOAL #1   Title I with advanced HEP for postural correction work and use of rowing machine at home for general wellness    Time 6    Period Weeks    Status New    Target Date 01/30/21      PT LONG TERM GOAL #2   Title improve FOTO =/better than 66    Time 6    Period Weeks    Status New    Target Date 01/30/21      PT LONG TERM GOAL #3   Title demo painfree cervical ROM WFL to allow her to perform normal acitivites without difficulty    Time 6    Period Weeks    Status New    Target Date 01/30/21      PT LONG TERM GOAL #4   Title improve Rt UE strength to = LT    Time 6    Period Weeks    Status New    Target Date 01/30/21      PT LONG TERM GOAL #5   Title report no new episodes of pain flare ups for 3 wks or more    Period Weeks    Status New    Target Date 01/30/21                 Plan - 01/05/21  1203    Clinical Impression Statement Patient tolerated ther ex well today.  PT focused on STM to reduce pain and improve motion with some increase in rotation since evaluation.  PT added upper trap stretching due to bands of tightness.  Also encouraged regular walking due to sedentary nature of job.    PT Treatment/Interventions Iontophoresis 4mg /ml Dexamethasone;Taping;Vasopneumatic Device;Patient/family education;Functional mobility training;Moist Heat;Traction;Therapeutic activities;Passive range of motion;Dry needling;Manual techniques;Neuromuscular re-education;Electrical Stimulation;Cryotherapy    PT Next Visit Plan progress postural exercises, manual as indicated    PT Home Exercise Plan Access Code:    Consulted and Agree with Plan of Care Patient           Patient will benefit from skilled therapeutic intervention in order to improve the following deficits and impairments:     Visit Diagnosis: Muscle weakness (generalized)  Cervicalgia  Other muscle spasm  Abnormal posture     Problem List Patient Active Problem List   Diagnosis Date Noted  . Neck muscle spasm 11/03/2020  . Migraine without aura and without status migrainosus, not intractable 12/16/2018  . Gastro-esophageal reflux disease without esophagitis 07/27/2016  . Irritable bowel syndrome with constipation 07/27/2016  . Contracture of tendon sheath 05/18/2016  . Plantar fasciitis 05/18/2016  . OSA (obstructive sleep apnea) 12/21/2013    Vallie Teters, PT 01/05/2021, 12:44 PM  [facility] [address] [place] [address] [place], [place], [place] Phone: [phone]   Fax:  [phone]  Name: Soraiya Ahner MRN: Danielle Rankin Date of Birth: [birth year]

## 2021-01-06 ENCOUNTER — Other Ambulatory Visit: Payer: Self-pay | Admitting: *Deleted

## 2021-01-06 MED ORDER — BACLOFEN 10 MG PO TABS: ORAL_TABLET | ORAL | 1 refills | Status: DC

## 2021-01-06 MED ORDER — BACLOFEN 10 MG PO TABS: ORAL_TABLET | ORAL | 0 refills | Status: DC

## 2021-01-10 ENCOUNTER — Other Ambulatory Visit: Payer: Self-pay | Admitting: Family Medicine

## 2021-01-10 MED ORDER — BACLOFEN 10 MG PO TABS: ORAL_TABLET | ORAL | 0 refills | Status: DC

## 2021-01-10 NOTE — Progress Notes (Signed)
b

## 2021-01-11 ENCOUNTER — Ambulatory Visit (INDEPENDENT_AMBULATORY_CARE_PROVIDER_SITE_OTHER): Payer: BC Managed Care – PPO | Admitting: Physical Therapy

## 2021-01-11 ENCOUNTER — Other Ambulatory Visit: Payer: Self-pay

## 2021-01-11 DIAGNOSIS — M62838 Other muscle spasm: Secondary | ICD-10-CM

## 2021-01-11 DIAGNOSIS — R293 Abnormal posture: Secondary | ICD-10-CM | POA: Diagnosis not present

## 2021-01-11 DIAGNOSIS — M6281 Muscle weakness (generalized): Secondary | ICD-10-CM

## 2021-01-11 DIAGNOSIS — M542 Cervicalgia: Secondary | ICD-10-CM

## 2021-01-11 NOTE — Therapy (Signed)
[facility] [place] [facility] [address] [place], [place], [address] Phone: [phone]   Fax:  [phone]  Physical Therapy Treatment  Patient Details  Name: Diane Cortez MRN: [id number] Date of Birth: 09-15-1966 Referring Provider (PT): Dr Clare Gandy   Encounter Date: 01/11/2021   PT End of Session - 01/11/21 1227    Visit Number 4    Number of Visits 6    Date for PT Re-Evaluation 01/30/21    Authorization Type BCBS    PT Start Time 1148    PT Stop Time 1230    PT Time Calculation (min) 42 min    Activity Tolerance Patient tolerated treatment well    Behavior During Therapy [facility] for tasks assessed/performed           Past Medical History:  Diagnosis Date  . DDD (degenerative disc disease), lumbar   . Migraines     Past Surgical History:  Procedure Laterality Date  . HERNIA REPAIR    . TONSILLECTOMY      There were no vitals filed for this visit.   Subjective Assessment - 01/11/21 1155    Subjective Pt states she feels "about the same"    Currently in Pain? No/denies                             [facility] Adult PT Treatment/Exercise - 01/11/21 0001      Neck Exercises: Machines for Strengthening   UBE (Upper Arm Bike) Level 2 x 4 min alt fwd/bkwd      Neck Exercises: Standing   Other Standing Exercises rows x 20 red TB wtih improved scapular retraction strength    Other Standing Exercises scapular depression red TB x 20 with cues      Neck Exercises: Seated   Shoulder Rolls Backwards;10 reps    Other Seated Exercise seated upper trap stretch 2 x 30sec bilat, levator stretch 2 x 30sec bilat      Neck Exercises: Supine   Upper Extremity D2 Extension;10 reps    UE D2 Limitations cues for technique. pain in Rt shoulder after 5 reps      Manual Therapy   Joint Mobilization gentle grade 2 PA mobs to c spine    Soft tissue mobilization bilat UT, scalenes, suboccipitals, cervical paraspinals     Manual Traction supine gentle manual traction                       PT Long Term Goals - 12/19/20 1309      PT LONG TERM GOAL #1   Title I with advanced HEP for postural correction work and use of rowing machine at home for general wellness    Time 6    Period Weeks    Status New    Target Date 01/30/21      PT LONG TERM GOAL #2   Title improve FOTO =/better than 66    Time 6    Period Weeks    Status New    Target Date 01/30/21      PT LONG TERM GOAL #3   Title demo painfree cervical ROM WFL to allow her to perform normal acitivites without difficulty    Time 6    Period Weeks    Status New    Target Date 01/30/21      PT LONG TERM GOAL #4   Title improve Rt UE strength to =  LT    Time 6    Period Weeks    Status New    Target Date 01/30/21      PT LONG TERM GOAL #5   Title report no new episodes of pain flare ups for 3 wks or more    Period Weeks    Status New    Target Date 01/30/21                 Plan - 01/11/21 1228    Clinical Impression Statement Pt with increased trigger points and mm spasticity in bilat cervical paraspinals this session. Responds well to manual therapy.  Pt requires cuing for posture and technique with standing therex    PT Next Visit Plan dry needling?  continue posture exercises and manual    PT Home Exercise Plan Access Code: [email or web address]    Consulted and Agree with Plan of Care Patient           Patient will benefit from skilled therapeutic intervention in order to improve the following deficits and impairments:     Visit Diagnosis: Muscle weakness (generalized)  Cervicalgia  Other muscle spasm  Abnormal posture     Problem List Patient Active Problem List   Diagnosis Date Noted  . Neck muscle spasm 11/03/2020  . Migraine without aura and without status migrainosus, not intractable 12/16/2018  . Gastro-esophageal reflux disease without esophagitis 07/27/2016  . Irritable bowel syndrome with  constipation 07/27/2016  . Contracture of tendon sheath 05/18/2016  . Plantar fasciitis 05/18/2016  . OSA (obstructive sleep apnea) 12/21/2013   Diane Cortez, PT  Diane Cortez 01/11/2021, 12:30 PM  [facility] [address] [place], [place], [address] Phone: [phone]   Fax:  [phone]  Name: Diane Cortez MRN: [id number] Date of Birth: 12/21/1965

## 2021-01-19 ENCOUNTER — Ambulatory Visit (INDEPENDENT_AMBULATORY_CARE_PROVIDER_SITE_OTHER): Payer: BC Managed Care – PPO | Admitting: Rehabilitative and Restorative Service Providers"

## 2021-01-19 ENCOUNTER — Other Ambulatory Visit: Payer: Self-pay

## 2021-01-19 DIAGNOSIS — M6281 Muscle weakness (generalized): Secondary | ICD-10-CM

## 2021-01-19 DIAGNOSIS — M62838 Other muscle spasm: Secondary | ICD-10-CM | POA: Diagnosis not present

## 2021-01-19 DIAGNOSIS — M542 Cervicalgia: Secondary | ICD-10-CM | POA: Diagnosis not present

## 2021-01-19 DIAGNOSIS — R293 Abnormal posture: Secondary | ICD-10-CM

## 2021-01-19 NOTE — Therapy (Signed)
[facility] Out[facility] [place] [address] [place], [place], [address] Phone: [phone]   Fax:  [phone]  Physical Therapy Treatment  Patient Details  Name: Diane Cortez MRN: [id number] Date of Birth: Mar 29, 1966 Referring Provider (PT): Dr Clare Gandy   Encounter Date: 01/19/2021   PT End of Session - 01/19/21 1155    Visit Number 5    Number of Visits 6    Date for PT Re-Evaluation 01/30/21    Authorization Type BCBS    PT Start Time 1151    PT Stop Time 1233    PT Time Calculation (min) 42 min    Activity Tolerance Patient tolerated treatment well    Behavior During Therapy [facility] for tasks assessed/performed           Past Medical History:  Diagnosis Date  . DDD (degenerative disc disease), lumbar   . Migraines     Past Surgical History:  Procedure Laterality Date  . HERNIA REPAIR    . TONSILLECTOMY      There were no vitals filed for this visit.   Subjective Assessment - 01/19/21 1154    Subjective The patient is not having pain.  She is working on getting more ROM.    Pertinent History plantar fascitis in Rt foot, decreased tightness of tensons in ankle - wears a stabilizing brace to put off having surgery. Has a rowing machine and attempts to use this periodically but it has bothered her lower body, thinks the neck and shoulder were ok with it.    Diagnostic tests xray - mild degeneratvie changes C3-4    Currently in Pain? No/denies              [facility] PT Assessment - 01/19/21 1156      Assessment   Medical Diagnosis Neck spasms    Referring Provider (PT) Dr Clare Gandy    Onset Date/Surgical Date 10/08/20    Hand Dominance Right                         [facility] Adult PT Treatment/Exercise - 01/19/21 1156      Exercises   Exercises Neck;Shoulder;Elbow      Elbow Exercises   Elbow Flexion Strengthening;Both;10 reps    Bar Weights/Barbell (Elbow Flexion) 2 lbs      Neck Exercises:  Machines for Strengthening   UBE (Upper Arm Bike) Level 2.5 minutes forward and 1 minute backward      Neck Exercises: Standing   Other Standing Exercises standing scapular retraction and depression      Neck Exercises: Supine   Cervical Isometrics Flexion;Extension;Right rotation;Left rotation;5 secs;5 reps    Neck Retraction 5 reps    Capital Flexion 5 reps    Capital Flexion Limitations Cued patient to lift head out of PT hands.  She was initially unable, PT performed AAROM and had her hold throughout ROM    Cervical Rotation Right;Left;5 reps      Shoulder Exercises: Standing   ABduction Strengthening;Both;5 reps    Shoulder ABduction Weight (lbs) 2    ABduction Limitations scaption    Extension Strengthening;Both;10 reps    Extension Weight (lbs) 2    Retraction Strengthening;Both;10 reps    Retraction Limitations anatomical hand position with cues on posture    Other Standing Exercises Overhead press x 5 reps x 2 sets with 2 lb weights alternating UEs      Shoulder Exercises: Lawyer Limitations door  frame stretch at W and V position x 30 seconds      Modalities   Modalities Electrical Stimulation;Moist Heat      Moist Heat Therapy   Number Minutes Moist Heat 12 Minutes    Moist Heat Location Cervical      Electrical Stimulation   Electrical Stimulation Location neck    Electrical Stimulation Action interferential    Electrical Stimulation Parameters to tolerance    Electrical Stimulation Goals Tone;Pain      Manual Therapy   Manual Therapy Joint mobilization;Soft tissue mobilization;Myofascial release;Manual Traction;Scapular mobilization    Manual therapy comments to reduce muscle guarding, improve ROM    Joint Mobilization grade II mobs for lateral glides mid to low c-spine and PA mobs    Soft tissue mobilization bilateral UT, scalenes, SCM and suboccipitals    Myofascial Release suboccipitals    Scapular Mobilization sidelying R parascapular  mobilization    Manual Traction supine gentle manual traction                  PT Education - 01/19/21 1237    Education Details education on DN-- patient prefers to wait until she reads through information    Person(s) Educated Patient    Methods Explanation    Comprehension Verbalized understanding               PT Long Term Goals - 12/19/20 1309      PT LONG TERM GOAL #1   Title I with advanced HEP for postural correction work and use of rowing machine at home for general wellness    Time 6    Period Weeks    Status New    Target Date 01/30/21      PT LONG TERM GOAL #2   Title improve FOTO =/better than 66    Time 6    Period Weeks    Status New    Target Date 01/30/21      PT LONG TERM GOAL #3   Title demo painfree cervical ROM WFL to allow her to perform normal acitivites without difficulty    Time 6    Period Weeks    Status New    Target Date 01/30/21      PT LONG TERM GOAL #4   Title improve Rt UE strength to = LT    Time 6    Period Weeks    Status New    Target Date 01/30/21      PT LONG TERM GOAL #5   Title report no new episodes of pain flare ups for 3 wks or more    Period Weeks    Status New    Target Date 01/30/21                 Plan - 01/19/21 1238    Clinical Impression Statement The patient reports no pain at rest, but continued tightness and pain with ROM.  She discussed a h/o pain with shoulder activities and PT had her perform light strengthening activities today.  With 2 lb (low load), repetitive type activities, she increases use of SCM musculature and upper trap to compensate for UE weakness.  PT ended today with modalities due to increased tightness after low load strengthening activities.    PT Treatment/Interventions Iontophoresis 4mg /ml Dexamethasone;Taping;Vasopneumatic Device;Patient/family education;Functional mobility training;Moist Heat;Traction;Therapeutic activities;Passive range of motion;Dry needling;Manual  techniques;Neuromuscular re-education;Electrical Stimulation;Cryotherapy    PT Next Visit Plan dry needling next session after patient reads handout-- check with her  on comfort level;  continue posture exercises and manual; consider continued strengthening (unable to lift head off pillow against gravity), continue isometrics and UE strengthening    PT Home Exercise Plan Access Code: [email or web address]    Consulted and Agree with Plan of Care Patient           Patient will benefit from skilled therapeutic intervention in order to improve the following deficits and impairments:     Visit Diagnosis: Muscle weakness (generalized)  Cervicalgia  Other muscle spasm  Abnormal posture     Problem List Patient Active Problem List   Diagnosis Date Noted  . Neck muscle spasm 11/03/2020  . Migraine without aura and without status migrainosus, not intractable 12/16/2018  . Gastro-esophageal reflux disease without esophagitis 07/27/2016  . Irritable bowel syndrome with constipation 07/27/2016  . Contracture of tendon sheath 05/18/2016  . Plantar fasciitis 05/18/2016  . OSA (obstructive sleep apnea) 12/21/2013    WEAVER,CHRISTINA, PT 01/19/2021, 12:41 PM  [facility] [address] [place], [place], [address] Phone: [phone]   Fax:  [phone]  Name: Diane Cortez MRN: [id number] Date of Birth: 10/24/1966

## 2021-01-26 ENCOUNTER — Ambulatory Visit: Payer: BC Managed Care – PPO | Admitting: Rehabilitative and Restorative Service Providers"

## 2021-01-26 ENCOUNTER — Other Ambulatory Visit: Payer: Self-pay

## 2021-01-26 ENCOUNTER — Encounter: Payer: Self-pay | Admitting: Rehabilitative and Restorative Service Providers"

## 2021-01-26 DIAGNOSIS — M62838 Other muscle spasm: Secondary | ICD-10-CM

## 2021-01-26 DIAGNOSIS — M6281 Muscle weakness (generalized): Secondary | ICD-10-CM

## 2021-01-26 DIAGNOSIS — R293 Abnormal posture: Secondary | ICD-10-CM

## 2021-01-26 DIAGNOSIS — M542 Cervicalgia: Secondary | ICD-10-CM | POA: Diagnosis not present

## 2021-01-26 NOTE — Therapy (Signed)
[place], [place], [address] Phone: [phone]   Fax:  [phone]  Physical Therapy Treatment and Renewal Summary  Patient Details  Name: Martin Smeal MRN: [id number] Date of Birth: 1966/03/14 Referring Provider (PT): Dr Clearance Coots   Encounter Date: 01/26/2021   PT End of Session - 01/26/21 1452    Visit Number 6    Number of Visits 6    Date for PT Re-Evaluation 01/30/21    Authorization Type BCBS    PT Start Time 1447    PT Stop Time 1528    PT Time Calculation (min) 41 min    Activity Tolerance Patient tolerated treatment well    Behavior During Therapy [facility] for tasks assessed/performed           Past Medical History:  Diagnosis Date  . DDD (degenerative disc disease), lumbar   . Migraines     Past Surgical History:  Procedure Laterality Date  . HERNIA REPAIR    . TONSILLECTOMY      There were no vitals filed for this visit.   Subjective Assessment - 01/26/21 1450    Subjective The patient is not having pain or muscle spasms anymore.  She gets some discomfort with end range AROM and feels tightness.    Pertinent History plantar fascitis in Rt foot, decreased tightness of tensons in ankle - wears a stabilizing brace to put off having surgery. Has a rowing machine and attempts to use this periodically but it has bothered her lower body, thinks the neck and shoulder were ok with it.    Diagnostic tests xray - mild degeneratvie changes C3-4    Currently in Pain? No/denies              [facility] PT Assessment - 01/26/21 1454      Assessment   Medical Diagnosis Neck spasms    Referring Provider (PT) Dr Clearance Coots    Onset Date/Surgical Date 10/08/20                         [facility] Adult PT Treatment/Exercise - 01/26/21 1455      Exercises   Exercises Neck;Shoulder;Elbow      Neck Exercises: Machines for Strengthening   UBE (Upper Arm Bike) L 3 x 2.5  minutes forward, 1 backward      Neck Exercises: Supine   Capital Flexion 5 reps    Capital Flexion Limitations gentle lifting of head out of PTs hands for neck strengthening    Cervical Rotation Right;Left;5 reps      Neck Exercises: Sidelying   Lateral Flexion Right;Left;5 reps      Modalities   Modalities Electrical Stimulation;Moist Heat      Moist Heat Therapy   Number Minutes Moist Heat 12 Minutes    Moist Heat Location Cervical      Electrical Stimulation   Electrical Stimulation Location neck    Electrical Stimulation Action --   interferential   Electrical Stimulation Parameters to tolerance    Electrical Stimulation Goals Tone;Pain      Manual Therapy   Manual Therapy Soft tissue mobilization    Manual therapy comments skilled palpation to assess response to STM and DN    Myofascial Release suboccipitals    Scapular Mobilization sidelying on L with R parascapular mobility            Trigger Point Dry Needling - 01/26/21 1817    Consent Given?  Yes    Education Handout Provided Yes    Muscles Treated Head and Neck Upper trapezius;Levator scapulae    Other Dry Needling bilateral/ pain on R side    Upper Trapezius Response Twitch reponse elicited;Palpable increased muscle length    Levator Scapulae Response Twitch response elicited;Palpable increased muscle length                     PT Long Term Goals - 01/26/21 1525      PT LONG TERM GOAL #1   Title I with advanced HEP for postural correction work and use of rowing machine at home for general wellness    Time 6    Period Weeks    Status Partially Met      PT LONG TERM GOAL #2   Title improve FOTO =/better than 66    Baseline *patient's score reduced from 64% to 49%.  This is most likely due to increased awareness of deficits versus a decline in status.    Time 6    Period Weeks    Status Not Met      PT LONG TERM GOAL #3   Title demo painfree cervical ROM WFL to allow her to perform normal  acitivites without difficulty    Baseline Gets pain at end range of cervical ROM    Time 6    Period Weeks    Status Partially Met      PT LONG TERM GOAL #4   Title improve Rt UE strength to = LT    Time 6    Period Weeks    Status On-going      PT LONG TERM GOAL #5   Title report no new episodes of pain flare ups for 3 wks or more    Period Weeks    Status Achieved             UPDATED LONG TERM GOALS:  PT Long Term Goals - 01/26/21 1820      PT LONG TERM GOAL #1   Title Pt will be indep with HEP progression.    Time 6    Period Weeks    Status Revised    Target Date 03/09/21      PT LONG TERM GOAL #2   Title improve FOTO =/better than 66    Baseline 49%    Time 6    Period Weeks    Status Revised    Target Date 03/09/21      PT LONG TERM GOAL #3   Title demo painfree cervical ROM WFL to allow her to perform normal acitivites without difficulty    Baseline Gets pain at end range of cervical ROM    Time 6    Period Weeks    Status Revised    Target Date 03/09/21      PT LONG TERM GOAL #4   Title improve Rt UE strength to = LT    Time 6    Period Weeks    Status Revised    Target Date 03/09/21              Plan - 01/26/21 1526    Clinical Impression Statement The patient has no pain at rest.  She does continue with pain during end range AROM activities.  She also has chronic weakness in UEs and continued soft tissue tightness in upper trapezius and levator musculature.  PT plans to renew to continue 2x/week x 4 more weeks working towards  updated and unmet LTGs.    Rehab Potential Good    PT Frequency 1x / week    PT Duration 4 weeks    PT Treatment/Interventions Iontophoresis 34m/ml Dexamethasone;Taping;Vasopneumatic Device;Patient/family education;Functional mobility training;Moist Heat;Traction;Therapeutic activities;Passive range of motion;Dry needling;Manual techniques;Neuromuscular re-education;Electrical Stimulation;Cryotherapy;ADLs/Self Care  Home Management    PT Next Visit Plan dry needling next session after patient reads handout-- check with her on comfort level;  continue posture exercises and manual; consider continued strengthening (unable to lift head off pillow against gravity), continue isometrics and UE strengthening    PT Home Exercise Plan Access Code: [email or web address]   Consulted and Agree with Plan of Care Patient           Patient will benefit from skilled therapeutic intervention in order to improve the following deficits and impairments:  Pain,Postural dysfunction,Decreased strength,Hypomobility,Increased muscle spasms,Impaired UE functional use,Decreased range of motion  Visit Diagnosis: Muscle weakness (generalized)  Cervicalgia  Other muscle spasm  Abnormal posture     Problem List Patient Active Problem List   Diagnosis Date Noted  . Neck muscle spasm 11/03/2020  . Migraine without aura and without status migrainosus, not intractable 12/16/2018  . Gastro-esophageal reflux disease without esophagitis 07/27/2016  . Irritable bowel syndrome with constipation 07/27/2016  . Contracture of tendon sheath 05/18/2016  . Plantar fasciitis 05/18/2016  . OSA (obstructive sleep apnea) 12/21/2013    Weslee Fogg , PT 01/26/2021, 6:20 PM  [facility] [address]Phone: [phone]  Fax:  [phone] Name: LElda DunkersonMRN: [id number] of Birth: 9Aug 19, 1967

## 2021-02-02 ENCOUNTER — Encounter: Payer: Self-pay | Admitting: Rehabilitative and Restorative Service Providers"

## 2021-02-02 ENCOUNTER — Other Ambulatory Visit: Payer: Self-pay

## 2021-02-02 ENCOUNTER — Ambulatory Visit: Payer: BC Managed Care – PPO | Admitting: Rehabilitative and Restorative Service Providers"

## 2021-02-02 DIAGNOSIS — M62838 Other muscle spasm: Secondary | ICD-10-CM | POA: Diagnosis not present

## 2021-02-02 DIAGNOSIS — M542 Cervicalgia: Secondary | ICD-10-CM

## 2021-02-02 DIAGNOSIS — M6281 Muscle weakness (generalized): Secondary | ICD-10-CM

## 2021-02-02 DIAGNOSIS — R293 Abnormal posture: Secondary | ICD-10-CM

## 2021-02-02 NOTE — Therapy (Signed)
[facility] Out[facility] Rehabilitation [place] [address] [place], [place], [address] Phone: [phone]   Fax:  [phone]  Physical Therapy Treatment  Patient Details  Name: Diane Cortez MRN: [id number] Date of Birth: 04-20-1966 Referring Provider (PT): Dr Clare Gandy   Encounter Date: 02/02/2021   PT End of Session - 02/02/21 1113    Visit Number 7    Number of Visits 14    Date for PT Re-Evaluation 03/09/21    Authorization Type BCBS    PT Start Time 1108    PT Stop Time 1148    PT Time Calculation (min) 40 min    Activity Tolerance Patient tolerated treatment well    Behavior During Therapy [facility] for tasks assessed/performed           Past Medical History:  Diagnosis Date  . DDD (degenerative disc disease), lumbar   . Migraines     Past Surgical History:  Procedure Laterality Date  . HERNIA REPAIR    . TONSILLECTOMY      There were no vitals filed for this visit.   Subjective Assessment - 02/02/21 1112    Subjective The patient feels a knot in the R upper trap region.    Pertinent History plantar fascitis in Rt foot, decreased tightness of tensons in ankle - wears a stabilizing brace to put off having surgery. Has a rowing machine and attempts to use this periodically but it has bothered her lower body, thinks the neck and shoulder were ok with it.    Diagnostic tests xray - mild degeneratvie changes C3-4    Currently in Pain? No/denies   discomfort in the right upper trap             [facility] PT Assessment - 02/02/21 1116      Assessment   Medical Diagnosis Neck spasms    Referring Provider (PT) Dr Clare Gandy    Onset Date/Surgical Date 10/08/20                         [facility] [place] [facility] Adult PT Treatment/Exercise - 02/02/21 1117      Exercises   Exercises Neck;Shoulder      Neck Exercises: Machines for Strengthening   UBE (Upper Arm Bike) L 2 x 2 minutes forward, 1 backward      Neck Exercises: Supine    Cervical Isometrics Flexion    Cervical Isometrics Limitations 4 reps      Neck Exercises: Sidelying   Other Sidelying Exercise open book thoracic spine      Shoulder Exercises: Supine   Horizontal ABduction Strengthening;Both;10 reps    Theraband Level (Shoulder Horizontal ABduction) Level 2 (Red)    External Rotation Strengthening;Both;10 reps    Theraband Level (Shoulder External Rotation) Level 2 (Red)    External Rotation Limitations a twinge of discomfort in the posterior aspect of the neck with shoulder ER    Diagonals Strengthening;Both;10 reps    Theraband Level (Shoulder Diagonals) Level 2 (Red)      Modalities   Modalities Moist Heat      Moist Heat Therapy   Number Minutes Moist Heat 12 Minutes    Moist Heat Location Cervical      Manual Therapy   Manual Therapy Taping;Joint mobilization;Soft tissue mobilization;Myofascial release    Manual therapy comments to reduce pain and improve moblity    Joint Mobilization grade II-III lateral mid-cervical glides    Soft tissue mobilization bilateral UT, scalenes, SCM and suboccipitals  Myofascial Release suboccipitals    Kinesiotex Inhibit Muscle      Kinesiotix   Inhibit Muscle  R upper trap                       PT Long Term Goals - 01/26/21 1820      PT LONG TERM GOAL #1   Title Pt will be indep with HEP progression.    Time 6    Period Weeks    Status Revised    Target Date 03/09/21      PT LONG TERM GOAL #2   Title improve FOTO =/better than 66    Baseline 49%    Time 6    Period Weeks    Status Revised    Target Date 03/09/21      PT LONG TERM GOAL #3   Title demo painfree cervical ROM WFL to allow her to perform normal acitivites without difficulty    Baseline Gets pain at end range of cervical ROM    Time 6    Period Weeks    Status Revised    Target Date 03/09/21      PT LONG TERM GOAL #4   Title improve Rt UE strength to = LT    Time 6    Period Weeks    Status Revised     Target Date 03/09/21                 Plan - 02/02/21 1123    Clinical Impression Statement PT continuing to progress strengthening to tolerance in bilateral UEs and upper back for postural support.    PT Treatment/Interventions Iontophoresis 4mg /ml Dexamethasone;Taping;Vasopneumatic Device;Patient/family education;Functional mobility training;Moist Heat;Traction;Therapeutic activities;Passive range of motion;Dry needling;Manual techniques;Neuromuscular re-education;Electrical Stimulation;Cryotherapy;ADLs/Self Care Home Management    PT Home Exercise Plan Access Code:    Consulted and Agree with Plan of Care Patient           Patient will benefit from skilled therapeutic intervention in order to improve the following deficits and impairments:     Visit Diagnosis: Muscle weakness (generalized)  Cervicalgia  Other muscle spasm  Abnormal posture     Problem List Patient Active Problem List   Diagnosis Date Noted  . Neck muscle spasm 11/03/2020  . Migraine without aura and without status migrainosus, not intractable 12/16/2018  . Gastro-esophageal reflux disease without esophagitis 07/27/2016  . Irritable bowel syndrome with constipation 07/27/2016  . Contracture of tendon sheath 05/18/2016  . Plantar fasciitis 05/18/2016  . OSA (obstructive sleep apnea) 12/21/2013    Delbra Zellars, PT 02/02/2021, 1:22 PM  [facility] [address] [place], [place], [place] Phone: [phone]   Fax:  [phone]  Name: Vallie Fayette MRN: Danielle Rankin Date of Birth: [birth year]

## 2021-02-08 ENCOUNTER — Ambulatory Visit: Payer: BC Managed Care – PPO | Admitting: Rehabilitative and Restorative Service Providers"

## 2021-02-08 ENCOUNTER — Other Ambulatory Visit: Payer: Self-pay

## 2021-02-08 DIAGNOSIS — M62838 Other muscle spasm: Secondary | ICD-10-CM

## 2021-02-08 DIAGNOSIS — R293 Abnormal posture: Secondary | ICD-10-CM

## 2021-02-08 DIAGNOSIS — M542 Cervicalgia: Secondary | ICD-10-CM | POA: Diagnosis not present

## 2021-02-08 DIAGNOSIS — M6281 Muscle weakness (generalized): Secondary | ICD-10-CM | POA: Diagnosis not present

## 2021-02-08 NOTE — Therapy (Signed)
[facility] [place] [address] [place], [place], [address] Phone: [phone]   Fax:  [phone]  Physical Therapy Treatment  Patient Details  Name: Diane Cortez MRN: [id number] Date of Birth: 1966-06-12 Referring Provider (PT): Dr Clare Gandy   Encounter Date: 02/08/2021   PT End of Session - 02/08/21 1022    Visit Number 8    Number of Visits 14    Date for PT Re-Evaluation 03/09/21    Authorization Type BCBS    PT Start Time 1018    PT Stop Time 1100    PT Time Calculation (min) 42 min    Activity Tolerance Patient tolerated treatment well    Behavior During Therapy [facility] for tasks assessed/performed           Past Medical History:  Diagnosis Date  . DDD (degenerative disc disease), lumbar   . Migraines     Past Surgical History:  Procedure Laterality Date  . HERNIA REPAIR    . TONSILLECTOMY      There were no vitals filed for this visit.   Subjective Assessment - 02/08/21 1019    Subjective The patient has increased pain in thoracic region between shoulder blades.  Pain worsened yesterday and she is unsure if there was an activity or if she slept wrong.    Pertinent History plantar fascitis in Rt foot, decreased tightness of tensons in ankle - wears a stabilizing brace to put off having surgery. Has a rowing machine and attempts to use this periodically but it has bothered her lower body, thinks the neck and shoulder were ok with it.    Diagnostic tests xray - mild degeneratvie changes C3-4    Currently in Pain? Yes    Pain Score 7     Pain Location Back    Pain Orientation Mid    Pain Descriptors / Indicators Aching;Sore;Discomfort    Pain Type Chronic pain    Pain Onset More than a month ago    Pain Frequency Intermittent    Aggravating Factors  unusre-- has needed inhaler and is coughing more  -- could be related to allergies ; now hurts to take a deep breath    Pain Relieving Factors none                              [facility] Adult PT Treatment/Exercise - 02/08/21 1025      Exercises   Exercises Neck;Shoulder      Shoulder Exercises: Prone   Retraction Strengthening;Both;10 reps    Extension Strengthening;Both;5 reps    Other Prone Exercises Prone V (cannot tolerate arm overhead from ROM standpoint) x 10 reps R and L sides    Other Prone Exercises prone thoracic press ups x 10 reps      Shoulder Exercises: Standing   Protraction Strengthening;Both;10 reps    Protraction Limitations wall lean with scapular engagement; then wall press for serratus with squishy ball    Flexion AROM;Both;10 reps    Flexion Limitations rolling a physioball up and down the wall      Modalities   Modalities Electrical Stimulation;Moist Heat      Moist Heat Therapy   Number Minutes Moist Heat 12 Minutes    Moist Heat Location Cervical   and thoracic     Electrical Stimulation   Electrical Stimulation Location --   thoracic   Electrical Stimulation Action interferential    Electrical Stimulation Parameters to  tolerance    Electrical Stimulation Goals Pain      Manual Therapy   Manual Therapy Soft tissue mobilization    Manual therapy comments to reduce pain    Soft tissue mobilization STM bilateral rhomboids and parascapular musculature                  PT Education - 02/08/21 1056    Education Details HEP    Person(s) Educated Patient    Methods Explanation;Demonstration;Handout    Comprehension Verbalized understanding               PT Long Term Goals - 01/26/21 1820      PT LONG TERM GOAL #1   Title Pt will be indep with HEP progression.    Time 6    Period Weeks    Status Revised    Target Date 03/09/21      PT LONG TERM GOAL #2   Title improve FOTO =/better than 66    Baseline 49%    Time 6    Period Weeks    Status Revised    Target Date 03/09/21      PT LONG TERM GOAL #3   Title demo painfree cervical ROM WFL to allow her to perform  normal acitivites without difficulty    Baseline Gets pain at end range of cervical ROM    Time 6    Period Weeks    Status Revised    Target Date 03/09/21      PT LONG TERM GOAL #4   Title improve Rt UE strength to = LT    Time 6    Period Weeks    Status Revised    Target Date 03/09/21                 Plan - 02/08/21 1057    Clinical Impression Statement The patient had increased pain beginning yesterday.  Unsure of origin of pain in thoracic region-- was cold at jury duty and wonders if posture irritated upper back or if coughing due to allergies irritated upper back.  We also discussed calling MD if pain continues due to pain with taking a deep breath.  She has point tenderness with muscle palpation today leading me to believe current symptoms are musculoskeletal.  PT to continue to tolerance.    PT Treatment/Interventions Iontophoresis 4mg /ml Dexamethasone;Taping;Vasopneumatic Device;Patient/family education;Functional mobility training;Moist Heat;Traction;Therapeutic activities;Passive range of motion;Dry needling;Manual techniques;Neuromuscular re-education;Electrical Stimulation;Cryotherapy;ADLs/Self Care Home Management    PT Next Visit Plan progress ther ex to tolerance.    PT Home Exercise Plan Access Code:    Consulted and Agree with Plan of Care Patient           Patient will benefit from skilled therapeutic intervention in order to improve the following deficits and impairments:     Visit Diagnosis: Muscle weakness (generalized)  Cervicalgia  Other muscle spasm  Abnormal posture     Problem List Patient Active Problem List   Diagnosis Date Noted  . Neck muscle spasm 11/03/2020  . Migraine without aura and without status migrainosus, not intractable 12/16/2018  . Gastro-esophageal reflux disease without esophagitis 07/27/2016  . Irritable bowel syndrome with constipation 07/27/2016  . Contracture of tendon sheath 05/18/2016  . Plantar  fasciitis 05/18/2016  . OSA (obstructive sleep apnea) 12/21/2013    Diane Cortez, PT 02/08/2021, 11:00 AM  [facility] [address] [place], [place], [place] Phone: [phone]   Fax:  [phone]  Name: Diane Cortez MRN: [id number] Date of Birth: [birth year]

## 2021-02-08 NOTE — Patient Instructions (Signed)
Access Code: [email or web address] URL: [email or web address] Date: 02/08/2021 Prepared by: Margretta Ditty  Exercises Seated Upper Trapezius Stretch - 2 x daily - 7 x weekly - 1 sets - 3 reps - 20-30 seconds hold Standing Cervical Retraction - 7-8 x daily - 5-10 reps Doorway Pec Stretch at 60 Elevation - 1 x daily - 3 reps - 30 hold Standing Backward Shoulder Rolls - 7-8 x daily - 5-10 reps Standing Shoulder Horizontal Abduction with Resistance - 1 x daily - 7 x weekly - 3 sets - 10 reps Standing Bilateral Low Shoulder Row with Anchored Resistance - 1 x daily - 7 x weekly - 3 sets - 10 reps Prone Press Up On Elbows - 2 x daily - 7 x weekly - 1 sets - 10 reps Prone Scapular Retraction - 2 x daily - 7 x weekly - 1 sets - 10 reps - 5 seconds hold

## 2021-02-10 ENCOUNTER — Other Ambulatory Visit: Payer: Self-pay | Admitting: Family Medicine

## 2021-02-15 ENCOUNTER — Encounter: Payer: Self-pay | Admitting: Rehabilitative and Restorative Service Providers"

## 2021-02-15 ENCOUNTER — Telehealth: Payer: Self-pay | Admitting: Family Medicine

## 2021-02-15 ENCOUNTER — Other Ambulatory Visit: Payer: Self-pay

## 2021-02-15 ENCOUNTER — Ambulatory Visit: Payer: BC Managed Care – PPO | Admitting: Rehabilitative and Restorative Service Providers"

## 2021-02-15 DIAGNOSIS — M542 Cervicalgia: Secondary | ICD-10-CM

## 2021-02-15 DIAGNOSIS — R293 Abnormal posture: Secondary | ICD-10-CM | POA: Diagnosis not present

## 2021-02-15 DIAGNOSIS — M62838 Other muscle spasm: Secondary | ICD-10-CM | POA: Diagnosis not present

## 2021-02-15 DIAGNOSIS — M6281 Muscle weakness (generalized): Secondary | ICD-10-CM | POA: Diagnosis not present

## 2021-02-15 MED ORDER — BACLOFEN 10 MG PO TABS: ORAL_TABLET | ORAL | 0 refills | Status: DC

## 2021-02-15 NOTE — Telephone Encounter (Signed)
Pt states pharmacy sent request for refill on : baclofen (LIORESAL) 10 MG tablet [[id number]]   Order Details Dose, Route, Frequency: As Directed  Dispense Quantity: 30 each Refills: 0       Sig: Take 0.5 tablets (5 mg total) by mouth 2 (two) times daily as needed for muscle spasms.        --Forwarding request to provider that if approved send refill order to :  Pharmacy  [facility] [id number] - [place], [place] - [address]  [address], [place] [address]  Phone:  [phone] Fax:  [phone]    --glh

## 2021-02-15 NOTE — Telephone Encounter (Signed)
Refilled baclofen   Myra Rude, MD [facility] 02/15/2021, 5:16 PM

## 2021-02-15 NOTE — Therapy (Signed)
[facility] [place] [address] [place], [place], [address] Phone: [phone]   Fax:  [phone]  Physical Therapy Treatment  Patient Details  Name: Diane Cortez MRN: [id number] Date of Birth: 06-Jan-1966 Referring Provider (PT): Dr Clare Gandy   Encounter Date: 02/15/2021   PT End of Session - 02/15/21 1207    Visit Number 9    Number of Visits 14    Date for PT Re-Evaluation 03/09/21    Authorization Type BCBS    PT Start Time 1024    PT Stop Time 1104    PT Time Calculation (min) 40 min    Activity Tolerance Patient tolerated treatment well    Behavior During Therapy [facility] for tasks assessed/performed           Past Medical History:  Diagnosis Date  . DDD (degenerative disc disease), lumbar   . Migraines     Past Surgical History:  Procedure Laterality Date  . HERNIA REPAIR    . TONSILLECTOMY      There were no vitals filed for this visit.   Subjective Assessment - 02/15/21 1026    Subjective The patient reports that the pain in her thoracic region is resolved.  She saw her doctor Monday and had an EKG -- she was treated for allergies.    Pertinent History plantar fascitis in Rt foot, decreased tightness of tensons in ankle - wears a stabilizing brace to put off having surgery. Has a rowing machine and attempts to use this periodically but it has bothered her lower body, thinks the neck and shoulder were ok with it.    Diagnostic tests xray - mild degeneratvie changes C3-4    Currently in Pain? Yes    Pain Score 4    sore   Pain Location Neck    Pain Descriptors / Indicators Sore    Pain Onset More than a month ago    Pain Frequency Intermittent    Aggravating Factors  mainly on the right side    Pain Relieving Factors none              [facility] PT Assessment - 02/15/21 1031      Assessment   Medical Diagnosis Neck spasms    Referring Provider (PT) Dr Clare Gandy    Onset Date/Surgical Date  10/08/20      AROM   Cervical - Right Rotation 70   some discomfort to the right side (on the right)   Cervical - Left Rotation 64      Strength   Right/Left Shoulder Right;Left    Right Shoulder Flexion 4/5    Right Shoulder ABduction 4-/5    Left Shoulder Flexion 4+/5    Left Shoulder ABduction 4/5                         [facility] Adult PT Treatment/Exercise - 02/15/21 1032      Exercises   Exercises Neck;Shoulder      Neck Exercises: Sidelying   Lateral Flexion Right;5 reps    Other Sidelying Exercise scapular retraction and stretching for upper trap in left sidelying (for R side)      Shoulder Exercises: Standing   Horizontal ABduction Strengthening;Both;12 reps    Flexion Strengthening;Right;Left;10 reps    Shoulder Flexion Weight (lbs) 2    Flexion Limitations altrnating R and L reaches    ABduction Strengthening;Right;Left;10 reps    Shoulder ABduction Weight (lbs) 2  ABduction Limitations bent arm abduction    Row Strengthening;Both;5 reps    Row Limitations stopped due to R shoulder elevation    Retraction Strengthening;Both;12 reps    Theraband Level (Shoulder Retraction) Level 2 (Red)    Other Standing Exercises Overhead reaching x 2 lbs R and L alternating x 10 reps *feels tightness and stretch on posterior aspect of the arm      Shoulder Exercises: ROM/Strengthening   UBE (Upper Arm Bike) L2 x 2 minutes forward/1 minute backward      Shoulder Exercises: Stretch   Other Shoulder Stretches wall stretch x 20 seconds in flexion      Manual Therapy   Manual Therapy Soft tissue mobilization    Soft tissue mobilization STM to R scalenes, upper trap and biceps region                  PT Education - 02/15/21 1207    Education Details HEP    Person(s) Educated Patient    Methods Explanation;Demonstration;Handout    Comprehension Verbalized understanding;Returned demonstration               PT Long Term Goals - 01/26/21 1820       PT LONG TERM GOAL #1   Title Pt will be indep with HEP progression.    Time 6    Period Weeks    Status Revised    Target Date 03/09/21      PT LONG TERM GOAL #2   Title improve FOTO =/better than 66    Baseline 49%    Time 6    Period Weeks    Status Revised    Target Date 03/09/21      PT LONG TERM GOAL #3   Title demo painfree cervical ROM WFL to allow her to perform normal acitivites without difficulty    Baseline Gets pain at end range of cervical ROM    Time 6    Period Weeks    Status Revised    Target Date 03/09/21      PT LONG TERM GOAL #4   Title improve Rt UE strength to = LT    Time 6    Period Weeks    Status Revised    Target Date 03/09/21                 Plan - 02/15/21 1208    Clinical Impression Statement The patient was progressing well with therapy and had an exacerbation of thoracic pain last week that has improved this week.  She feels overall increase in neck discomfort since then as well.  She continues with bilateral shoulder weakness and cervical weakness wiht muscle guarding.  PT progressing to patient tolerance.    PT Treatment/Interventions Iontophoresis 4mg /ml Dexamethasone;Taping;Vasopneumatic Device;Patient/family education;Functional mobility training;Moist Heat;Traction;Therapeutic activities;Passive range of motion;Dry needling;Manual techniques;Neuromuscular re-education;Electrical Stimulation;Cryotherapy;ADLs/Self Care Home Management    PT Next Visit Plan progress ther ex to tolerance.    PT Home Exercise Plan Access Code:    Consulted and Agree with Plan of Care Patient           Patient will benefit from skilled therapeutic intervention in order to improve the following deficits and impairments:     Visit Diagnosis: Muscle weakness (generalized)  Cervicalgia  Other muscle spasm  Abnormal posture     Problem List Patient Active Problem List   Diagnosis Date Noted  . Neck muscle spasm 11/03/2020  .  Migraine without aura and without status migrainosus,  not intractable 12/16/2018  . Gastro-esophageal reflux disease without esophagitis 07/27/2016  . Irritable bowel syndrome with constipation 07/27/2016  . Contracture of tendon sheath 05/18/2016  . Plantar fasciitis 05/18/2016  . OSA (obstructive sleep apnea) 12/21/2013    Diane Cortez 02/15/2021, 12:10 PM  [facility] [address] [place], [place], [address] Phone: [phone]   Fax:  [phone]  Name: Diane Cortez MRN: [id number] Date of Birth: 02-07-1966

## 2021-02-15 NOTE — Patient Instructions (Signed)
Access Code: [email or web address] URL: [email or web address] Date: 02/15/2021 Prepared by: Margretta Ditty  Exercises Seated Upper Trapezius Stretch - 2 x daily - 7 x weekly - 1 sets - 3 reps - 20-30 seconds hold Standing Cervical Retraction - 7-8 x daily - 5-10 reps Doorway Pec Stretch at 60 Elevation - 1 x daily - 3 reps - 30 hold Standing Bicep Stretch at Wall - 2 x daily - 7 x weekly - 1 sets - 10 reps Standing Backward Shoulder Rolls - 7-8 x daily - 5-10 reps Standing Shoulder Horizontal Abduction with Resistance - 1 x daily - 7 x weekly - 3 sets - 10 reps Standing Bilateral Low Shoulder Row with Anchored Resistance - 1 x daily - 7 x weekly - 3 sets - 10 reps Prone Press Up On Elbows - 2 x daily - 7 x weekly - 1 sets - 10 reps Prone Scapular Retraction - 2 x daily - 7 x weekly - 1 sets - 10 reps - 5 seconds hold

## 2021-03-02 ENCOUNTER — Other Ambulatory Visit: Payer: Self-pay

## 2021-03-02 ENCOUNTER — Ambulatory Visit: Payer: BC Managed Care – PPO | Admitting: Rehabilitative and Restorative Service Providers"

## 2021-03-02 DIAGNOSIS — R293 Abnormal posture: Secondary | ICD-10-CM | POA: Diagnosis not present

## 2021-03-02 DIAGNOSIS — M62838 Other muscle spasm: Secondary | ICD-10-CM

## 2021-03-02 DIAGNOSIS — M542 Cervicalgia: Secondary | ICD-10-CM

## 2021-03-02 DIAGNOSIS — M6281 Muscle weakness (generalized): Secondary | ICD-10-CM | POA: Diagnosis not present

## 2021-03-02 NOTE — Patient Instructions (Signed)
Access Code: [email or web address] URL: [email or web address] Date: 03/02/2021 Prepared by: Margretta Ditty  Exercises Seated Upper Trapezius Stretch - 2 x daily - 7 x weekly - 1 sets - 3 reps - 20-30 seconds hold Standing Cervical Retraction - 7-8 x daily - 5-10 reps Doorway Pec Stretch at 60 Elevation - 1 x daily - 3 reps - 30 hold Standing Bicep Stretch at Wall - 2 x daily - 7 x weekly - 1 sets - 3 reps - 30 seconds hold Standing Backward Shoulder Rolls - 7-8 x daily - 5-10 reps Scaption with Dumbbells - 2 x daily - 7 x weekly - 1 sets - 5-10 reps Standing Bilateral Low Shoulder Row with Anchored Resistance - 1 x daily - 7 x weekly - 3 sets - 10 reps Prone Press Up On Elbows - 2 x daily - 7 x weekly - 1 sets - 10 reps Prone Scapular Retraction - 2 x daily - 7 x weekly - 1 sets - 10 reps - 5 seconds hold

## 2021-03-02 NOTE — Therapy (Signed)
[facility] [place], [place], [address] Phone: [phone]   Fax:  [phone]  Physical Therapy Treatment and Discharge Summary  Patient Details  Name: Diane Cortez MRN: [id number] Date of Birth: [birth year] Referring Provider (PT): Dr Clearance Coots  PHYSICAL THERAPY DISCHARGE SUMMARY  Visits from Start of Care: 10  Current functional level related to goals / functional outcomes: See goals below   Remaining deficits: Weakness-- has HEP   Education / Equipment: HEP  Plan: Patient agrees to discharge.  Patient goals were partially met. Patient is being discharged due to being pleased with the current functional level.  ?????        Encounter Date: 03/02/2021   PT End of Session - 03/02/21 1035    Visit Number 10    Number of Visits 14    Date for PT Re-Evaluation 03/09/21    Authorization Type BCBS    PT Start Time 1022    PT Stop Time 1046    PT Time Calculation (min) 24 min    Activity Tolerance Patient tolerated treatment well    Behavior During Therapy WFL for tasks assessed/performed           Past Medical History:  Diagnosis Date  . DDD (degenerative disc disease), lumbar   . Migraines     Past Surgical History:  Procedure Laterality Date  . HERNIA REPAIR    . TONSILLECTOMY      There were no vitals filed for this visit.   Subjective Assessment - 03/02/21 1023    Subjective The patient reports she is no longer having pain.  She has reduced baclofen to 1x/day.  She still notes some weakness with overhead lifting.    Pertinent History plantar fascitis in Rt foot, decreased tightness of tensons in ankle - wears a stabilizing brace to put off having surgery. Has a rowing machine and attempts to use this periodically but it has bothered her lower body, thinks the neck and shoulder were ok with it.    Diagnostic tests xray - mild degeneratvie changes C3-4    Currently in Pain?  No/denies              [facility] PT Assessment - 03/02/21 1025      Assessment   Medical Diagnosis Neck spasms    Referring Provider (PT) Dr Clearance Coots    Onset Date/Surgical Date 10/08/20    Hand Dominance Right      AROM   Cervical - Right Rotation notes a little twinge, but not significant pain with bilat rotation      Strength   Right/Left Shoulder Right;Left    Right Shoulder Flexion 4/5    Right Shoulder ABduction 4/5    Left Shoulder Flexion 4/5    Left Shoulder ABduction 5/5                         [facility] Adult PT Treatment/Exercise - 03/02/21 1058      Self-Care   Self-Care Other Self-Care Comments    Other Self-Care Comments  discussed continued strengthening post d/c      Exercises   Exercises Neck;Shoulder      Elbow Exercises   Bar Weights/Barbell (Elbow Flexion) 3 lbs      Neck Exercises: Standing   Neck Retraction 10 reps      Shoulder Exercises: Standing   ABduction Strengthening;Right;Left;10 reps    Shoulder ABduction Weight (lbs) 3  ABduction Limitations scaption    Row Strengthening;Both;10 reps    Theraband Level (Shoulder Row) Level 2 (Red)      Shoulder Exercises: Stretch   Elbow Flexion Strengthening;Both;10 reps      Neck Exercises: Stretches   Other Neck Stretches shoulder rolls                  PT Education - 03/02/21 1044    Education Details HEP    Person(s) Educated Patient    Methods Explanation;Demonstration;Handout    Comprehension Verbalized understanding;Returned demonstration               PT Long Term Goals - 03/02/21 1044      PT LONG TERM GOAL #1   Title Pt will be indep with HEP progression.    Time 6    Period Weeks    Status Achieved      PT LONG TERM GOAL #2   Title improve FOTO =/better than 66    Baseline 67%    Time 6    Period Weeks    Status Achieved      PT LONG TERM GOAL #3   Title demo painfree cervical ROM WFL to allow her to perform normal acitivites  without difficulty    Baseline Twinge at end range    Time 6    Period Weeks    Status Partially Met      PT LONG TERM GOAL #4   Title improve Rt UE strength to = LT    Time 6    Period Weeks    Status Not Met                 Plan - 03/02/21 1101    Clinical Impression Statement The patient has partially met LTGs.  The patient arrives without pain.  PT had held resistance exercises for past 2 weeks due to acute upper back/thoracic pain.  She has returned to no pain and PT modified program to re-introduce strengthening for HEP.  The patient continues with R UE weakness > L UE weakness.  WEakness in her shoulders leads to compensatory overuse of cervical musculature.  This strength did not change t/o therapy despite strengthening.  The patient also has a h/o R AFO use due to weakness and plantar fascitis.  If weakness progresses or continues, she may benefit from further workup to r/o causes of systemic muscle weakness.   Educated patient to see MD if weakness worsens or remains the same with ther ex.    PT Treatment/Interventions Iontophoresis 89m/ml Dexamethasone;Taping;Vasopneumatic Device;Patient/family education;Functional mobility training;Moist Heat;Traction;Therapeutic activities;Passive range of motion;Dry needling;Manual techniques;Neuromuscular re-education;Electrical Stimulation;Cryotherapy;ADLs/Self Care Home Management    PT Next Visit Plan discharge    PT Home Exercise Plan Access Code: [email or web address]   Consulted and Agree with Plan of Care Patient           Patient will benefit from skilled therapeutic intervention in order to improve the following deficits and impairments:     Visit Diagnosis: Muscle weakness (generalized)  Cervicalgia  Other muscle spasm  Abnormal posture     Problem List Patient Active Problem List   Diagnosis Date Noted  . Neck muscle spasm 11/03/2020  . Migraine without aura and without status migrainosus, not intractable 12/16/2018  .  Gastro-esophageal reflux disease without esophagitis 07/27/2016  . Irritable bowel syndrome with constipation 07/27/2016  . Contracture of tendon sheath 05/18/2016  . Plantar fasciitis 05/18/2016  . OSA (obstructive sleep apnea) 12/21/2013  [place] [facility] [place] 03/02/2021, 1:18 PM  [facility] [place], [place], [address] Phone: [phone]   Fax:  [phone]  Name: Diane Cortez MRN: [id number] Date of Birth: 11/15/1965

## 2022-09-11 ENCOUNTER — Ambulatory Visit
Admission: EM | Admit: 2022-09-11 | Discharge: 2022-09-11 | Disposition: A | Discharge status: Home / Self Care | Payer: BC Managed Care – PPO | Attending: Family Medicine | Admitting: Family Medicine

## 2022-09-11 ENCOUNTER — Ambulatory Visit (INDEPENDENT_AMBULATORY_CARE_PROVIDER_SITE_OTHER): Payer: BC Managed Care – PPO

## 2022-09-11 DIAGNOSIS — M25572 Pain in left ankle and joints of left foot: Secondary | ICD-10-CM | POA: Diagnosis not present

## 2022-09-11 DIAGNOSIS — M1710 Unilateral primary osteoarthritis, unspecified knee: Secondary | ICD-10-CM | POA: Insufficient documentation

## 2022-09-11 DIAGNOSIS — M7662 Achilles tendinitis, left leg: Secondary | ICD-10-CM

## 2022-09-11 DIAGNOSIS — K5790 Diverticulosis of intestine, part unspecified, without perforation or abscess without bleeding: Secondary | ICD-10-CM | POA: Insufficient documentation

## 2022-09-11 MED ORDER — PREDNISONE 20 MG PO TABS: 20.0000 mg | ORAL_TABLET | Freq: Every day | ORAL | 0 refills | Status: DC

## 2022-09-11 MED ORDER — OXYCODONE HCL 5 MG PO TABS: 5.0000 mg | ORAL_TABLET | Freq: Four times a day (QID) | ORAL | 0 refills | Status: DC | PRN

## 2022-09-11 NOTE — ED Provider Notes (Signed)
[facility]    CSN: [id number] Arrival date & time: 09/11/22  1749      History   Chief Complaint Chief Complaint  Patient presents with   Ankle Pain    LT, NO injury    HPI Diane Cortez is a 56 y.o. female.   HPI  Patient has left ankle pain.  She had no injury.  She had no overuse.  She had no change in shoewear.  She does not do a lot of walking at work.  Unclear reason why her ankle and foot is hurting.  She states is getting worse with time.  Hurts a lot to put weight on her foot.  She has been wearing a plantar fascia brace at nighttime which holds her foot at 90 degrees.  Not sure this is helping.  She has tried some over-the-counter medicines. I reviewed her chart and she has had some problems with her right ankle in the past.  She had an MRI of her right ankle that showed plantar fasciitis and Achilles tendinitis.  She states that she wore a boot for this.  She does have a specialist that she sees.  The pain she is describing now is in the back of her ankle similar to the Achilles tendinitis She does have flatfeet She does have morbid obesity  Past Medical History:  Diagnosis Date   DDD (degenerative disc disease), lumbar    Migraines     Patient Active Problem List   Diagnosis Date Noted   Morbid obesity ([place]) 09/11/2022   Primary osteoarthritis of knee 09/11/2022   Diverticular disease 09/11/2022   Migraine without aura and without status migrainosus, not intractable 12/16/2018   Gastro-esophageal reflux disease without esophagitis 07/27/2016   Irritable bowel syndrome with constipation 07/27/2016   Plantar fasciitis 05/18/2016   OSA (obstructive sleep apnea) 12/21/2013    Past Surgical History:  Procedure Laterality Date   HERNIA REPAIR     TONSILLECTOMY      OB History   No obstetric history on file.      Home Medications    Prior to Admission medications   Medication Sig Start Date End Date Taking? Authorizing Provider   montelukast (SINGULAIR) 10 MG tablet Take 1 tablet by mouth daily. 03/31/19  Yes [provider]  oxyCODONE (ROXICODONE) 5 MG immediate release tablet Take 1-2 tablets (5-10 mg total) by mouth every 6 (six) hours as needed for severe pain. 09/11/22  Yes Raylene Everts, MD  almotriptan (AXERT) 12.5 MG tablet Take by mouth. 10/17/17 10/16/20  [provider]  baclofen (LIORESAL) 10 MG tablet Take 0.5 tablets (5 mg total) by mouth 2 (two) times daily as needed for muscle spasms. 02/15/21   Rosemarie Ax, MD  dicyclomine (BENTYL) 10 MG capsule Take by mouth. 06/03/20   [provider]  lansoprazole (PREVACID) 30 MG capsule Take 30 mg by mouth daily at 12 noon.    [provider]  magnesium oxide (MAG-OX) 400 MG tablet Take by mouth.    [provider]  predniSONE (DELTASONE) 20 MG tablet Take 1 tablet (20 mg total) by mouth daily with breakfast. Prednisone 20 mg, in mornings with breakfast as follows: Take 3 pills for 3 days, take 2 pills for 3 days, and take 1 pill for 3 days. 09/11/22   Raylene Everts, MD  sucralfate (CARAFATE) 1 GM/10ML suspension as needed. 10/16/17   [provider]  topiramate ER (QUDEXY XR) 100 MG CS24 sprinkle capsule Take  by mouth.    [provider]  WEGOVY 2.4 MG/0.75ML SOAJ  09/14/20   [provider]    Family History Family History  Problem Relation Age of Onset   Diabetes Mother    Cancer Father    Diabetes Sister    Diabetes Brother    Healthy Sister     Social History Social History   Tobacco Use   Smoking status: Never   Smokeless tobacco: Never  Substance Use Topics   Alcohol use: No   Drug use: No     Allergies   Hydrocodone-acetaminophen and Ivp dye [iodinated contrast media]   Review of Systems Review of Systems See HPI  Physical Exam Triage Vital Signs ED Triage Vitals  Enc Vitals Group     BP 09/11/22 1806 116/87     Pulse Rate 09/11/22 1806 72      Resp 09/11/22 1806 17     Temp 09/11/22 1806 97.8 F (36.6 C)     Temp Source 09/11/22 1806 Oral     SpO2 09/11/22 1806 99 %     Weight --      Height --      Head Circumference --      Peak Flow --      Pain Score 09/11/22 1807 8     Pain Loc --      Pain Edu? --      Excl. in [place]? --    No data found.  Updated Vital Signs BP 116/87 (BP Location: Right Arm)   Pulse 72   Temp 97.8 F (36.6 C) (Oral)   Resp 17   SpO2 99%     Physical Exam Constitutional:      General: She is not in acute distress.    Appearance: She is well-developed. She is obese.  HENT:     Head: Normocephalic and atraumatic.  Eyes:     Conjunctiva/sclera: Conjunctivae normal.     Pupils: Pupils are equal, round, and reactive to light.  Cardiovascular:     Rate and Rhythm: Normal rate.  Pulmonary:     Effort: Pulmonary effort is normal. No respiratory distress.  Abdominal:     General: There is no distension.     Palpations: Abdomen is soft.  Musculoskeletal:        General: Normal range of motion.     Cervical back: Normal range of motion.       Feet:  Feet:     Comments: Area of tenderness Skin:    General: Skin is warm and dry.  Neurological:     Mental Status: She is alert.     Gait: Gait abnormal.     Comments: Comes in in wheelchair      UC Treatments / Results  Labs (all labs ordered are listed, but only abnormal results are displayed) Labs Reviewed - No data to display  EKG   Radiology DG Ankle Complete Left  Result Date: 09/11/2022 CLINICAL DATA:  Pain at Achilles tendon. Left ankle pain for 3-4 days. No known injury. EXAM: LEFT ANKLE COMPLETE - 3+ VIEW COMPARISON:  None Available. FINDINGS: There is no evidence of fracture, dislocation, or joint effusion. Faint Achilles tendon enthesophyte. There is a moderate plantar calcaneal spur. Small calcifications adjacent to the dorsal navicular appear chronic. Mild soft tissue edema. IMPRESSION: 1. Moderate plantar calcaneal spur.  Faint Achilles tendon enthesophyte. 2. Mild soft tissue edema. Electronically Signed   By: Keith Rake M.D.   On: 09/11/2022  18:58    Procedures Procedures (including critical care time)  Medications Ordered in UC Medications - No data to display  Initial Impression / Assessment and Plan / UC Course  I have reviewed the triage vital signs and the nursing notes.  Pertinent labs & imaging results that were available during my care of the patient were reviewed by me and considered in my medical decision making (see chart for details).     Final Clinical Impressions(s) / UC Diagnoses   Final diagnoses:  Acute left ankle pain  Achilles bursitis of left lower extremity     Discharge Instructions      Take the prednisone as directed Wear the boot at all times that you are up and walking Follow-up with your usual foot specialist Take Tylenol for moderate pain Take oxycodone as needed for severe pain Do not have on the oxycodone     ED Prescriptions     Medication Sig Dispense Auth. Provider   predniSONE (DELTASONE) 20 MG tablet Take 1 tablet (20 mg total) by mouth daily with breakfast. Prednisone 20 mg, in mornings with breakfast as follows: Take 3 pills for 3 days, take 2 pills for 3 days, and take 1 pill for 3 days. 18 tablet Raylene Everts, MD   oxyCODONE (ROXICODONE) 5 MG immediate release tablet Take 1-2 tablets (5-10 mg total) by mouth every 6 (six) hours as needed for severe pain. 15 tablet Raylene Everts, MD      I have reviewed the PDMP during this encounter.   Raylene Everts, MD 09/11/22 [phone]

## 2022-09-11 NOTE — Discharge Instructions (Signed)
Take the prednisone as directed Wear the boot at all times that you are up and walking Follow-up with your usual foot specialist Take Tylenol for moderate pain Take oxycodone as needed for severe pain Do not have on the oxycodone

## 2022-09-11 NOTE — ED Triage Notes (Signed)
Pt c/o LT ankle pain x 3-4 days. Pain located near achilles. No injury, just states it started hurting late Friday. Hurts to bare weight. Pain 8/10 Tylenol, Ice, heat, aircast boot at home prn.

## 2022-09-12 ENCOUNTER — Telehealth: Payer: Self-pay | Admitting: Emergency Medicine

## 2022-09-12 NOTE — Telephone Encounter (Signed)
Call to see how  patient was doing - she is taking the prednisone and the narcotic pain med as directed - it is a little better. She will follow up with her regular doctor as directed.

## 2022-09-25 IMAGING — DX DG CERVICAL SPINE COMPLETE 4+V
7 series · 7 of 7 positions shown · non-contrast
Comparison: None.

CLINICAL DATA: Neck pain radiating down the right arm at times. No
injury.

EXAM:
CERVICAL SPINE - COMPLETE 4+ VIEW

[c-spine lat]
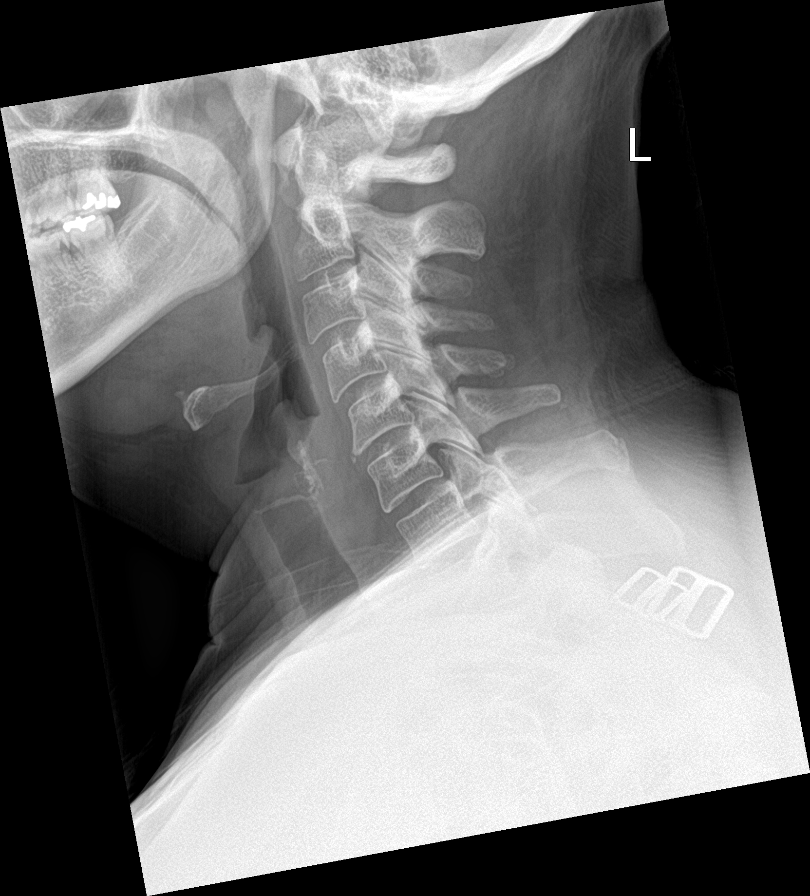

[c-spine obl (1 of 2)]
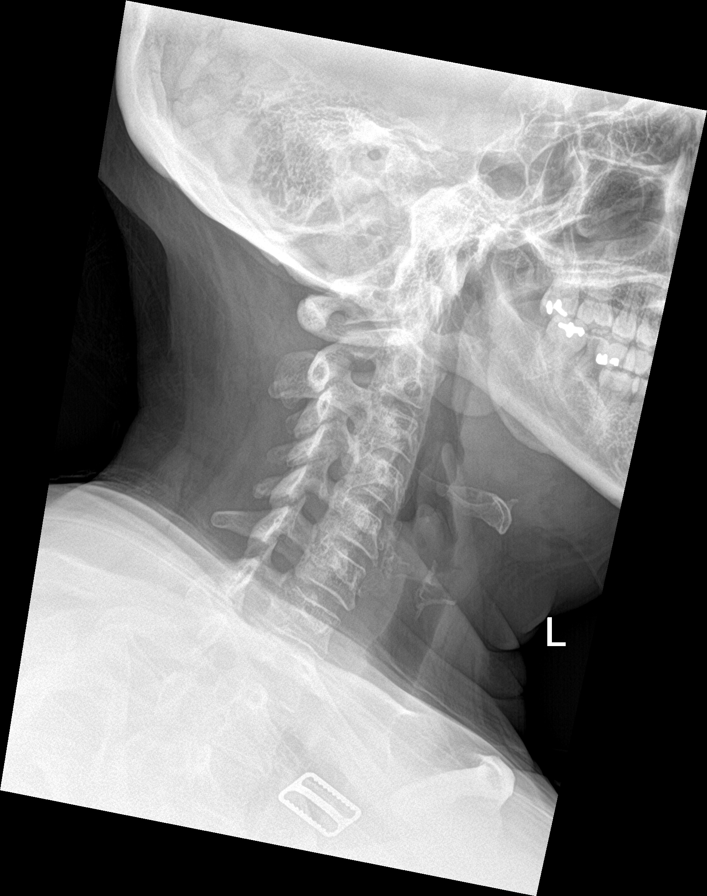

[c-spine obl (2 of 2)]
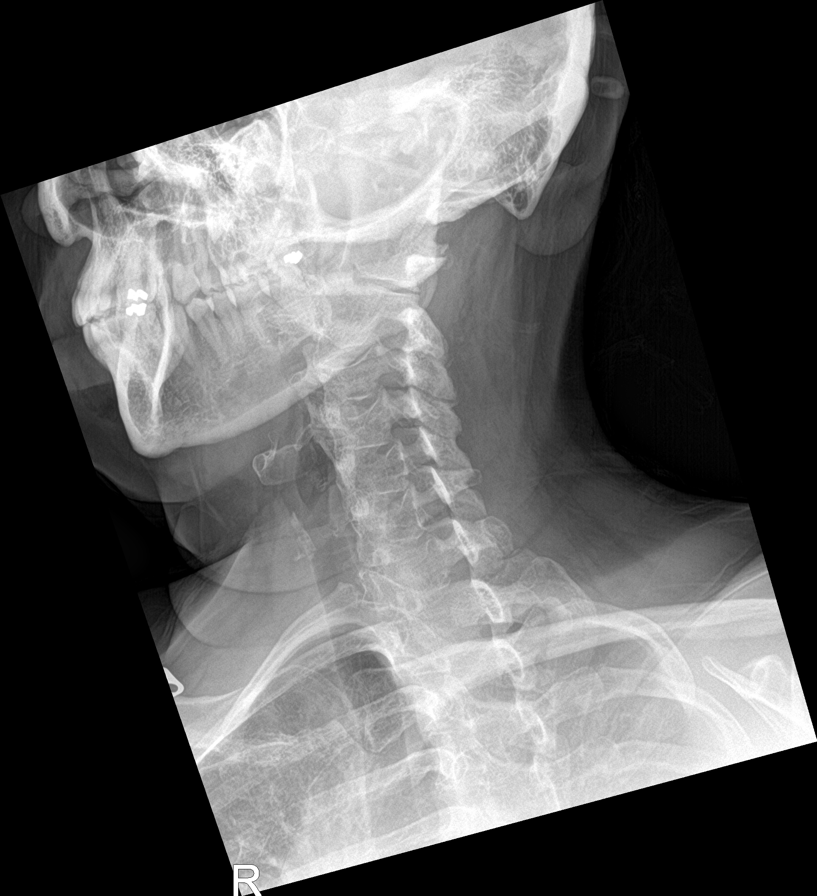

[c-spine ap]
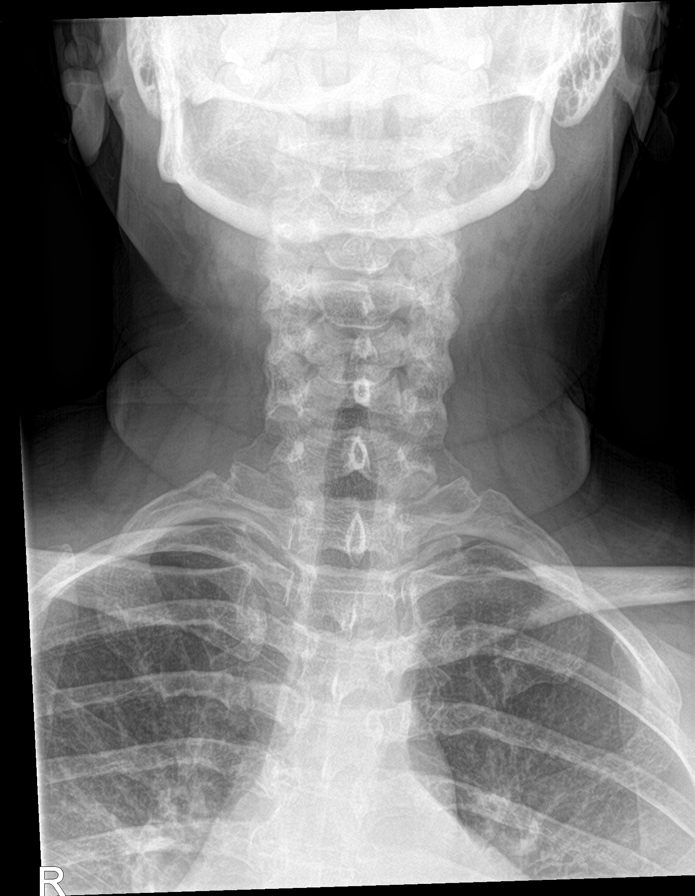

[c-spine open mouth]
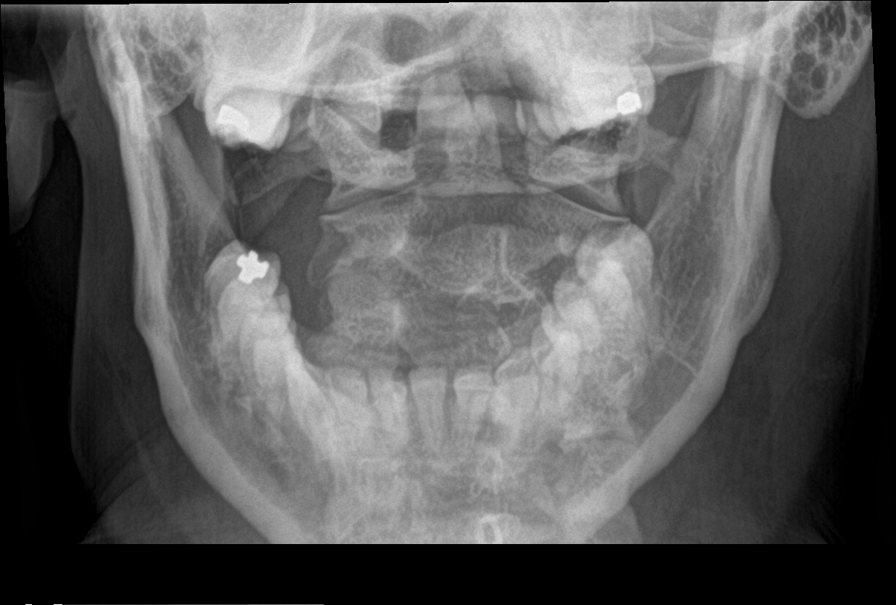

[c-spine swimmers]
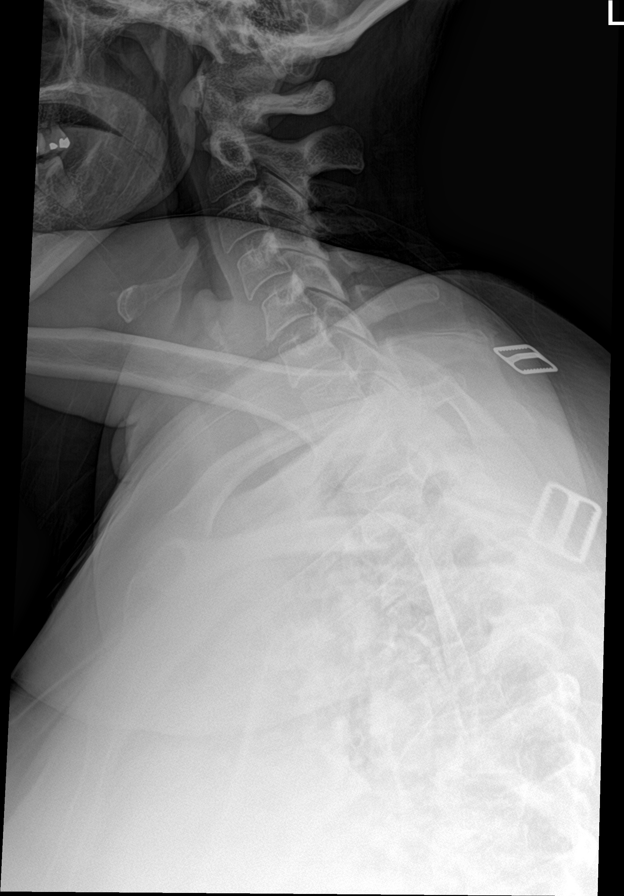

[[person_name]]
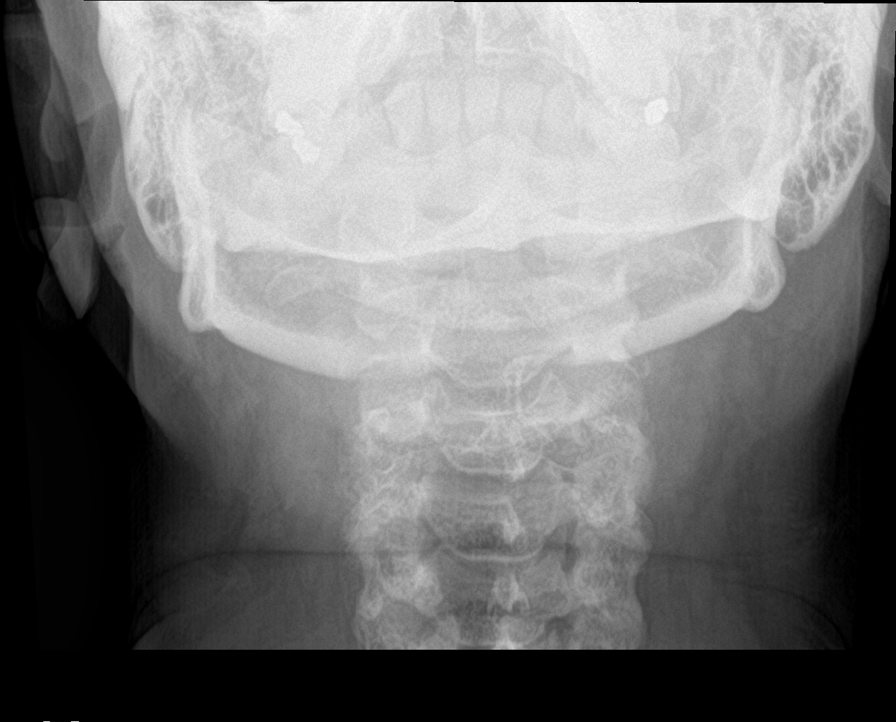

[7 of 7 positions shown; findings below may reference images not displayed]

FINDINGS: No fracture, bone lesion or spondylolisthesis. Disc spaces are well
maintained. Minor encroachment on the right C3-C4 neural foramina
due to facet spurring. Remaining neural foramina are all widely
patent.

Normal soft tissues.
IMPRESSION: 1. Mild facet degenerative change on the right at C3-C4. No other
abnormalities.

## 2023-02-18 ENCOUNTER — Encounter: Payer: Self-pay | Admitting: *Deleted

## 2024-09-04 ENCOUNTER — Ambulatory Visit
Admission: EM | Admit: 2024-09-04 | Discharge: 2024-09-04 | Disposition: A | Discharge status: Home / Self Care | Attending: Family Medicine | Admitting: Family Medicine

## 2024-09-04 DIAGNOSIS — R079 Chest pain, unspecified: Secondary | ICD-10-CM

## 2024-09-04 NOTE — ED Triage Notes (Signed)
 Pt c/o chest pain since last night. Denies cardiac hx. Pain worse with deep breaths. Tylenol last night with no relief. States she did have a few episodes of vomiting this am. Some dizziness and HA as well.

## 2024-09-04 NOTE — Discharge Instructions (Addendum)
 Advised patient/mother go to [facility] ED now for further evaluation of chest pain and to rule out pulmonary embolism.

## 2024-09-04 NOTE — ED Provider Notes (Signed)
 [facility]    CSN: [id number] Arrival date & time: 09/04/24  1123      History   Chief Complaint Chief Complaint  Patient presents with   Chest Pain    HPI Diane Cortez is a 58 y.o. female.   HPI Pleasant 58 year old female presents with chest pain since last night.  Reports pain worse with deep breaths.  PMH significant for morbid obesity, migraine without aura, and IBS with constipation  Past Medical History:  Diagnosis Date   DDD (degenerative disc disease), lumbar    Migraines     Patient Active Problem List   Diagnosis Date Noted   Morbid obesity ([facility]) 09/11/2022   Primary osteoarthritis of knee 09/11/2022   Diverticular disease 09/11/2022   Migraine without aura and without status migrainosus, not intractable 12/16/2018   Gastro-esophageal reflux disease without esophagitis 07/27/2016   Irritable bowel syndrome with constipation 07/27/2016   Plantar fasciitis 05/18/2016   OSA (obstructive sleep apnea) 12/21/2013    Past Surgical History:  Procedure Laterality Date   GASTRECTOMY     HERNIA REPAIR     TONSILLECTOMY      OB History   No obstetric history on file.      Home Medications    Prior to Admission medications   Medication Sig Start Date End Date Taking? Authorizing Provider  almotriptan (AXERT) 12.5 MG tablet Take by mouth. 10/17/17 10/16/20  [provider]  magnesium oxide (MAG-OX) 400 MG tablet Take by mouth.    [provider]  montelukast (SINGULAIR) 10 MG tablet Take 1 tablet by mouth daily. 03/31/19   [provider]  sucralfate (CARAFATE) 1 GM/10ML suspension as needed. 10/16/17   [provider]  topiramate ER (QUDEXY XR) 100 MG CS24 sprinkle capsule Take by mouth.    [provider]    Family History Family History  Problem Relation Age of Onset   Diabetes Mother    Cancer Father    Diabetes Sister    Diabetes Brother    Healthy Sister     Social History Social  History   Tobacco Use   Smoking status: Never   Smokeless tobacco: Never  Substance Use Topics   Alcohol use: No   Drug use: No     Allergies   Hydrocodone-acetaminophen and Ivp dye [iodinated contrast media]   Review of Systems Review of Systems  Cardiovascular:  Positive for chest pain.  Neurological:  Positive for dizziness.  All other systems reviewed and are negative.    Physical Exam Triage Vital Signs ED Triage Vitals  Encounter Vitals Group     BP      Girls Systolic BP Percentile      Girls Diastolic BP Percentile      Boys Systolic BP Percentile      Boys Diastolic BP Percentile      Pulse      Resp      Temp      Temp src      SpO2      Weight      Height      Head Circumference      Peak Flow      Pain Score      Pain Loc      Pain Education      Exclude from Growth Chart    No data found.  Updated Vital Signs BP (!) 141/89 (BP Location: Right Arm)   Pulse 65   Temp (!) 97.4 F (  36.3 C) (Oral)   Resp 17   SpO2 99%    Physical Exam Vitals and nursing note reviewed.  Constitutional:      General: She is not in acute distress.    Appearance: Normal appearance. She is obese. She is ill-appearing.  HENT:     Head: Normocephalic and atraumatic.     Mouth/Throat:     Mouth: Mucous membranes are moist.     Pharynx: Oropharynx is clear.  Eyes:     Extraocular Movements: Extraocular movements intact.     Conjunctiva/sclera: Conjunctivae normal.     Pupils: Pupils are equal, round, and reactive to light.  Neck:     Comments: No JVD, no bruit Cardiovascular:     Rate and Rhythm: Normal rate and regular rhythm.     Pulses: Normal pulses.     Heart sounds: Normal heart sounds. No murmur heard.    No friction rub. No gallop.  Pulmonary:     Effort: Pulmonary effort is normal.     Breath sounds: Normal breath sounds. No wheezing, rhonchi or rales.  Musculoskeletal:        General: Normal range of motion.  Skin:    General: Skin is warm  and dry.  Neurological:     General: No focal deficit present.     Mental Status: She is alert and oriented to person, place, and time. Mental status is at baseline.  Psychiatric:        Mood and Affect: Mood normal.        Behavior: Behavior normal.      UC Treatments / Results  Labs (all labs ordered are listed, but only abnormal results are displayed) Labs Reviewed - No data to display  EKG   Radiology No results found.  Procedures Procedures (including critical care time)  Medications Ordered in UC Medications - No data to display  Initial Impression / Assessment and Plan / UC Course  I have reviewed the triage vital signs and the nursing notes.  Pertinent labs & imaging results that were available during my care of the patient were reviewed by me and considered in my medical decision making (see chart for details).     MDM: 1.  Chest pain, unspecified-EKG revealed sinus bradycardia, otherwise normal EKG. Advised patient/mother go to [facility] ED now for further evaluation of chest pain and to rule out pulmonary embolism.  Patient/mother agreed and verbalized understanding of these instructions and this plan of care today.  Patient discharged to ED, hemodynamically stable. Final Clinical Impressions(s) / UC Diagnoses   Final diagnoses:  Chest pain, unspecified type     Discharge Instructions      Advised patient/mother go to [facility] ED now for further evaluation of chest pain and to rule out pulmonary embolism.     ED Prescriptions   None    PDMP not reviewed this encounter.   Teddy Sharper, FNP 09/04/24 1326

## 2024-10-05 NOTE — Progress Notes (Signed)
 Physical Therapy Initial Assessment  Patient Name:  Diane Cortez Date of Birth:  05-Oct-1966 Today's Date:  October 05, 2024 Referred by:  Waddell Victory PARAS, DPM  Visit #: 1 of     Precautions:      Assessment/Plan   Plan for next session: trial cupping to right gastroc and plantar heel, calf stretching/strengthening  Assessment & Plan   Assessment  Assessment details: PT diagnosis: pain in right foot PT prognosis: good  The patient presents to PT for evaluation and treatment of right chronic plantar fasciitis. Objectively, pt found to have decreased ankle motion (subtalar fusion) and pain in weightbearing. Due to chronicity of symptoms, pt would benefit from more aggressive treatment strategy with use of IASTM, cupping, and/or dry needling as tolerated along with therapeutic exercise.      Prognosis:  good Potential barriers:  no barriers Patient clinical presentation comments:  The patient requires skilled Physical Therapy services through the use of planned therapy and modality interventions listed below in order to address the impairments mentioned above/at initial evaluation.     Upon system's review, no comorbid conditions were identified to contraindicate therapeutic interventions.   Impairments: abnormal gait, abnormal or restricted ROM and pain with function  Plan  Therapy options: will be seen for skilled physical therapy services   Planned modality interventions: cryotherapy, iontophoresis, TENS and thermotherapy (hydrocollator packs)  Planned therapy interventions: functional ROM exercises, home exercise program, joint mobilization, manual therapy, neuromuscular re-education, postural training, soft tissue mobilization, strengthening, stretching and therapeutic activitiesOther planned therapy interventions: IASTM, cupping Frequency: 2x week Duration in weeks: 4 Plan details: Patient/caregiver reported no cultural and/or religious practices that should be  considered in patient's treatment plan. No psychosocial barriers to treatment have been identified at this time. No stresses, losses or important life events within the last twelve months were identified at this time. Patient/caregiver were informed and in agreement with treatment plan risks and benefits.  Continual Care Information Refer to clinical note for plan details.            Subjective/History   Subjective Evaluation    History of Present Illness   Subjective/Mechanism of injury: The patient is a 58 y.o. female presenting to physical therapy for evaluation and treatment of right foot pain occurring for multiple years but has worsened since surgery in June 2025 (subtalar arthrodesis) and subsequent recovery.   Precautions:  The patient denies the following symptoms: unexplained weight loss, heavy night sweats, appetite loss, excessive fatigue, unusual lumps or swelling, unexplained bleeding, and unexplained bowel and bladder changes. Pain  Current pain rating: 5 At worst pain rating: 9  Location: Plantar heel Relieving factors: medications and rest (stretching)  Aggravating factors: Weightbearing, stairs  Social Support   Occupation:  Work from home    Objective  There were no vitals filed for this visit.  Objective Lower Extremity ROM & Strength: Anything left blank was deferred at this time  []  ROM WFL in all planes unless otherwise noted below        []  Strength WFL Grossly in all planes unless otherwise noted below  Motion (Normal) AROM PROM Strength Comments   Right Left Right Left Right Left           Hip:                Flexion (0-120o)                Extension (0-20o)  Abduction (0-45o)                Adduction (0-30o)                ER (0-50o)                IR (0-45o)                       Knee:                Flexion (0-130o)                Extension (0o)                       Ankle:                Dorsiflexion (0-20o)  10         4+      Plantarflexion (0-45o)  30        4+      Inversion (0-20o)  5       4       Eversion (0-15o)  5        4             Toes:                Extension (0-20o)                2nd Digit         3rd Digit         4th Digit         5th Digit            Mobility/Gait:  Pt ambulates with decreased weightbearing on the right foot while using SPC   Patient Specific Functional Score   Activity 1 Score: 2 (Walking at home without AD) Activity 2 Score: 4 (stairs) Activity 3 Score: 1 (walking in community) PSFS Score: 2.33          Standardized Testing      Today's Treatment/Intervention   Patient was seen today for initial physical therapy (PT) evaluation and received education on her condition, treatment plan, rationale, therapeutic goals and expectations were discussed. A home exercise program (HEP) was initiated; patient verbalized and demonstrated proper technique of HEP.  Treatments Performed:    Interventions Performed    Anything left blank was deferred at this time During all sensitive area treatments, patient provided continual verbal consent before and during each intervention.  Remote Therapeutic Monitoring:   Therapeutic Ex [phone]):  - Long Sitting Plantar Fascia Stretch with Towel  - 2 x daily - 7 x weekly - 2 sets - 30sec hold - Towel Scrunches  - 2 x daily - 7 x weekly - 2 sets - 1 min hold - Ankle and Toe Plantarflexion with Resistance  - 2 x daily - 7 x weekly - 2 sets - 10 reps  Therapeutic Act (02469):    Manual (02859):    Neuro Re-Ed (02887):    Gait Training [phone]):    Aquatic Therapy [phone]):  Modalities:  No modalities used at this time  Education:  The patient was educated regarding: therapy diagnosis, anatomy and physiology, treatment plan, rationale, therapeutic goals, expectations, and home exercise program. All patient questions were entertained and answered today.  The education provided was required by a skilled therapist  through active lecturing and/or demonstration and the time  spent was included in the appropriate/correlative billed codes.    HEP Provided:  Access Code: [email or web address] URL: [email or web address] Date: 10/05/2024 Prepared by: Alm Lowes  Exercises - Long Sitting Plantar Fascia Stretch with Towel  - 2 x daily - 7 x weekly - 2 sets - 30sec hold - Towel Scrunches  - 2 x daily - 7 x weekly - 2 sets - 1 min hold - Ankle and Toe Plantarflexion with Resistance  - 2 x daily - 7 x weekly - 2 sets - 10 reps  Skilled PT required for exercise performance to ensure proper technique for continued safe & effective progression.  Each CPT code is separate and distinct therefore medically necessary.    Goals & Plan of Care   Start Date: 10/05/2024   Patient Goal: reduce pain   Pt will be independent with HEP to improve their ability to self-manage symptoms and reduce risk of recurrence.  Pt will increase right ankle PF and DF strength to 5/5 to improve their ability to perform functional mobility/ADLs.  Pt will report 2/10 or less pain with functional activity and less occurrence of sleep disturbance d/t pain.  Pt will improve PSFS average score from 2.33 at initial evaluation by >2 in order to demonstrate functional improvement.  Problem List/History   Problem List: Problem List[1]  History: Past Medical History[2]  Past Surgical History:   Past Surgical History[3]  Treatment Time   Today's Evaluation/Treatment: 9284  9244 Total Time: 40 min Certification Period: 10/05/24 to 12/28/24  Date for PT Re-Evaluation: 11/02/24   Charges   Total Time Code Treatment Minutes: 20  Evaluation Time Entry PT Evaluation (Low Complex) Time Entry: 20 PT Therapeutic Time Entry Therapeutic Exercise Time Entry: 20 (1 unit)  Complexity Level Justification:  This patient qualifies as a  low complexity evaluation.  Assessment was completed as documented above and 2 body systems were examined  using standardized testing. Upon review of the patients personal factors that impact the plan of care and required a  low complexity of clinical decision making with considerations of assessment results, comorbidities, and individualized treatment options.  Key Low Moderate High  Body Systems 1-2 3+ 4+  Personal factors 0 1-2 3+    Alm Lowes, PT 10/05/2024 8:10 AM  [facility] [place] [facility] [address] [place] [address] Dept: [phone] Dept Fax: [phone]           [1] Patient Active Problem List Diagnosis   Arthritis   Migraine without aura and without status migrainosus, not intractable   IBS (irritable bowel syndrome)   GERD (gastroesophageal reflux disease)   OSA (obstructive sleep apnea)   Environmental allergies   Contracture of tendon sheath   Family history of colonic polyps   Neck muscle spasm   Morbid obesity (*)   Lumbar spondylosis   Lumbar radiculopathy   Lumbar facet arthropathy   DDD (degenerative disc disease), lumbar   Lumbar foraminal stenosis   History of gastric bypass   Pain in right ankle and joints of right foot   Sinus tarsitis, right   Subtalar joint instability, right   Arthritis of subtalar joint   Postsurgical malabsorption (*)   Right foot pain   Atypical chest pain   Dizziness   Bradycardia, sinus  [2] Past Medical History: Diagnosis Date   Arthritis    osteoarthritis   Bronchial asthma (*)    PER PT. WHEN I GET SICK.   Diverticulosis    GERD (gastroesophageal reflux  disease)    History of echocardiogram    05/2023: EF 55-60%   History of gastric bypass    IBS (irritable bowel syndrome)    Inflammatory bowel disease    Kidney stones    Migraines    Neck muscle spasm 11/03/2020   Sinus bradycardia    Sinus Bradycardia, HR 55 -Negative small nonspecific precordial T-waves in V1 and V2. WITHIN NORMAL LIMITS  I have personally  reviewed recent ECG from  April 26, 2023. My interpretation -  Sinus bradycardia, HR 50  Small nonspecific T wave changes in lead V1.   Sleep apnea    CPAP/COMPLIANT 06/11/23.  [3] Past Surgical History: Procedure Laterality Date   Colonoscopy     Foot surgery  11/05/2008   Left foot / nerve repair   Right foot/ bone spurs/bunionectomy   Hernia repair  11/05/2001   Sleeve gastric bypass  07/03/2023   Tonsillectomy  01/012003

## 2024-10-08 NOTE — Progress Notes (Signed)
 Physical Therapy Daily Treatment  Patient Name:  Diane Cortez Date of Birth:  21-Oct-1966 Today's Date:  October 08, 2024 Referred by:  Mace Shera POUR, MD  Visit #: 2 of 8   Precautions:      Assessment/Plan    Assessment & Plan   Assessment  Assessment details: From IE:  PT diagnosis: pain in right foot PT prognosis: good  The patient presents to PT for evaluation and treatment of right chronic plantar fasciitis. Objectively, pt found to have decreased ankle motion (subtalar fusion) and pain in weightbearing. Due to chronicity of symptoms, pt would benefit from more aggressive treatment strategy with use of IASTM, cupping, and/or dry needling as tolerated along with therapeutic exercise.  Today:  Pt tolerated stretching and light strengthening with no unusual difficulty. No significant response to manual efforts today. Will try IASTM to the plantar fascia next session, according to pt tolerance and general longer term response to cupping today.       Prognosis:  good Potential barriers:  no barriers Patient clinical presentation comments:  The patient requires skilled Physical Therapy services through the use of planned therapy and modality interventions listed below in order to address the impairments mentioned above/at initial evaluation.     Upon system's review, no comorbid conditions were identified to contraindicate therapeutic interventions.   Impairments: abnormal gait, abnormal or restricted ROM and pain with function  Plan  Therapy options: will be seen for skilled physical therapy services   Planned modality interventions: cryotherapy, iontophoresis, TENS and thermotherapy (hydrocollator packs)  Planned therapy interventions: functional ROM exercises, home exercise program, joint mobilization, manual therapy, neuromuscular re-education, postural training, soft tissue mobilization, strengthening, stretching and therapeutic activitiesOther planned  therapy interventions: IASTM, cupping Frequency: 2x week Duration in weeks: 4 Plan details: Patient/caregiver reported no cultural and/or religious practices that should be considered in patient's treatment plan. No psychosocial barriers to treatment have been identified at this time. No stresses, losses or important life events within the last twelve months were identified at this time. Patient/caregiver were informed and in agreement with treatment plan risks and benefits.  Continual Care Information Refer to clinical note for plan details.            Subjective/History   Subjective Evaluation    History of Present Illness   Subjective/Mechanism of injury: From IE:  The patient is a 58 y.o. female presenting to physical therapy for evaluation and treatment of right foot pain occurring for multiple years but has worsened since surgery in June 2025 (subtalar arthrodesis) and subsequent recovery.   Today:  Pt reports 7 or 8/ 10 achy pain at the R plantar fascia. HEP going ok.  Precautions:  The patient denies the following symptoms: unexplained weight loss, heavy night sweats, appetite loss, excessive fatigue, unusual lumps or swelling, unexplained bleeding, and unexplained bowel and bladder changes. Pain  Relieving factors: medications and rest (stretching)  Aggravating factors: Weightbearing, stairs  Social Support   Occupation:  Work from home    Objective  There were no vitals filed for this visit.  FROM IE:   Objective Lower Extremity ROM & Strength: Anything left blank was deferred at this time  []  ROM WFL in all planes unless otherwise noted below        []  Strength WFL Grossly in all planes unless otherwise noted below  Motion (Normal) AROM PROM Strength Comments   Right Left Right Left Right Left  Hip:                Flexion (0-120o)                Extension (0-20o)                Abduction (0-45o)                Adduction (0-30o)                 ER (0-50o)                IR (0-45o)                       Knee:                Flexion (0-130o)                Extension (0o)                       Ankle:                Dorsiflexion (0-20o)  10        4+      Plantarflexion (0-45o)  30        4+      Inversion (0-20o)  5       4       Eversion (0-15o)  5        4             Toes:                Extension (0-20o)                2nd Digit         3rd Digit         4th Digit         5th Digit            Mobility/Gait:  Pt ambulates with decreased weightbearing on the right foot while using SPC   Patient Specific Functional Score   Activity 1 Score: 2 (Walking at home without AD) Activity 2 Score: 4 (stairs) Activity 3 Score: 1 (walking in community) PSFS Score: 2.33  Today's Treatment/Intervention    Treatments Performed:    Interventions Performed    Anything left blank was deferred at this time During all sensitive area treatments, patient provided continual verbal consent before and during each intervention.  Remote Therapeutic Monitoring:   Therapeutic Ex [phone]): R foot:  Nustep 6.5 min lvl 3 Gastroc stretch at wedge 10 sec x 10  Soleus stretch at wedge 10 sec x 10  PF stretch at wall 10 sec x 10  Ankle DF lvl 3 band x 20 R Ankle PF lvl 3 band x 20 R   Therapeutic Act (02469):    Manual (02859):  Cupping to the R PF, gastroc and achilles areas to decrease pain and tightness in area, including static and dynamic cupping to the PF area  Neuro Re-Ed (02887):    Gait Training (02883):    Aquatic Therapy [phone]):  Modalities:  No modalities used at this time  Education:  The patient was educated regarding: therapy diagnosis, anatomy and physiology, treatment plan, rationale, therapeutic goals, expectations, and home exercise program. All patient questions were entertained and answered today.  The education provided was required by a skilled therapist through active lecturing  and/or demonstration and the  time spent was included in the appropriate/correlative billed codes.    HEP Provided:  Access Code: [email or web address] URL: [email or web address] Date: 10/05/2024 Prepared by: Alm Lowes  Exercises - Long Sitting Plantar Fascia Stretch with Towel  - 2 x daily - 7 x weekly - 2 sets - 30sec hold - Towel Scrunches  - 2 x daily - 7 x weekly - 2 sets - 1 min hold - Ankle and Toe Plantarflexion with Resistance  - 2 x daily - 7 x weekly - 2 sets - 10 reps  Skilled PT required for exercise performance to ensure proper technique for continued safe & effective progression.  Each CPT code is separate and distinct therefore medically necessary.    Goals & Plan of Care   Start Date: 10/05/2024   Patient Goal: reduce pain   Pt will be independent with HEP to improve their ability to self-manage symptoms and reduce risk of recurrence.  Pt will increase right ankle PF and DF strength to 5/5 to improve their ability to perform functional mobility/ADLs.  Pt will report 2/10 or less pain with functional activity and less occurrence of sleep disturbance d/t pain.  Pt will improve PSFS average score from 2.33 at initial evaluation by >2 in order to demonstrate functional improvement.  Problem List/History   Problem List: Problem List[1]  History: Past Medical History[2]  Past Surgical History:   Past Surgical History[3]  Treatment Time   Today's Evaluation/Treatment: 1020  1100 Total Time: 40 min Certification Period: 10/05/24 to 12/28/24  Date for PT Re-Evaluation: 11/02/24   Charges   Total Time Code Treatment Minutes: 40  PT Therapeutic Time Entry Therapeutic Exercise Time Entry: 30 (2 units) Manual Therapy Time Entry: 10 (1 unit)  Vicenta Devonshire, PTA 10/08/2024 12:23 PM  [facility] [place] [facility] [address] [place] [address] Dept: [phone] Dept Fax: [phone]             [1] Patient Active Problem  List Diagnosis   Arthritis   Migraine without aura and without status migrainosus, not intractable   IBS (irritable bowel syndrome)   GERD (gastroesophageal reflux disease)   OSA (obstructive sleep apnea)   Environmental allergies   Contracture of tendon sheath   Family history of colonic polyps   Neck muscle spasm   Morbid obesity (*)   Lumbar spondylosis   Lumbar radiculopathy   Lumbar facet arthropathy   DDD (degenerative disc disease), lumbar   Lumbar foraminal stenosis   History of gastric bypass   Pain in right ankle and joints of right foot   Sinus tarsitis, right   Subtalar joint instability, right   Arthritis of subtalar joint   Postsurgical malabsorption (*)   Right foot pain   Atypical chest pain   Dizziness   Bradycardia, sinus  [2] Past Medical History: Diagnosis Date   Arthritis    osteoarthritis   Bronchial asthma (*)    PER PT. WHEN I GET SICK.   Diverticulosis    GERD (gastroesophageal reflux disease)    History of echocardiogram    05/2023: EF 55-60%   History of gastric bypass    IBS (irritable bowel syndrome)    Inflammatory bowel disease    Kidney stones    Migraines    Neck muscle spasm 11/03/2020   Sinus bradycardia    Sinus Bradycardia, HR 55 -Negative small nonspecific precordial T-waves in V1 and V2. WITHIN NORMAL LIMITS  I have personally reviewed recent  ECG from  April 26, 2023. My interpretation -  Sinus bradycardia, HR 50  Small nonspecific T wave changes in lead V1.   Sleep apnea    CPAP/COMPLIANT 06/11/23.  [3] Past Surgical History: Procedure Laterality Date   Colonoscopy     Foot surgery  11/05/2008   Left foot / nerve repair   Right foot/ bone spurs/bunionectomy   Hernia repair  11/05/2001   Sleeve gastric bypass  07/03/2023   Tonsillectomy  01/012003

## 2024-10-22 ENCOUNTER — Other Ambulatory Visit: Payer: Self-pay

## 2024-10-22 ENCOUNTER — Ambulatory Visit: Attending: Cardiology

## 2024-10-22 DIAGNOSIS — R262 Difficulty in walking, not elsewhere classified: Secondary | ICD-10-CM | POA: Diagnosis present

## 2024-10-22 DIAGNOSIS — M6281 Muscle weakness (generalized): Secondary | ICD-10-CM | POA: Insufficient documentation

## 2024-10-22 DIAGNOSIS — M79671 Pain in right foot: Secondary | ICD-10-CM | POA: Insufficient documentation

## 2024-10-22 NOTE — Therapy (Signed)
 OUTPATIENT PHYSICAL THERAPY LOWER EXTREMITY EVALUATION   Patient Name: Diane Cortez MRN: [id number] DOB:01/25/1966, 58 y.o., female Today's Date: 10/22/2024  END OF SESSION:  PT End of Session - 10/22/24 1216     Visit Number 1    Date for Recertification  12/17/24    PT Start Time 0800    PT Stop Time 0845    PT Time Calculation (min) 45 min          Past Medical History:  Diagnosis Date   DDD (degenerative disc disease), lumbar    Migraines    Past Surgical History:  Procedure Laterality Date   GASTRECTOMY     HERNIA REPAIR     TONSILLECTOMY     Patient Active Problem List   Diagnosis Date Noted   Morbid obesity (HCC) 09/11/2022   Primary osteoarthritis of knee 09/11/2022   Diverticular disease 09/11/2022   Migraine without aura and without status migrainosus, not intractable 12/16/2018   Gastro-esophageal reflux disease without esophagitis 07/27/2016   Irritable bowel syndrome with constipation 07/27/2016   Plantar fasciitis 05/18/2016   OSA (obstructive sleep apnea) 12/21/2013    PCP: Mace Sink, MD  REFERRING PROVIDER: Youlanda Juba, MD  REFERRING DIAG: R foot plantarfasciitis  THERAPY DIAG:  Pain in right foot  Difficulty in walking, not elsewhere classified  Muscle weakness (generalized)  Rationale for Evaluation and Treatment: Rehabilitation  ONSET DATE: June 2025  SUBJECTIVE:   SUBJECTIVE STATEMENT: Plantarfascitis R foot, worse after static positioning, sharp pain .  Also pain with prolonged standing   PERTINENT HISTORY: Had subtalar fusion R June 5, ongoing plantarfascitis prior to that surgery, has continued afterwards.   PAIN:  Are you having pain? Yes: NPRS scale: 0 to 6 Pain location: L plantar foot and heel, radiates into medial lower leg at times Pain description: sharp, intense pain with weight bearing Aggravating factors: standing, walking Relieving factors: ice, meds sometimes  PRECAUTIONS: None  RED  FLAGS: None   WEIGHT BEARING RESTRICTIONS: No  FALLS:  Has patient fallen in last 6 months? No  LIVING ENVIRONMENT: Lives with: lives with their family Lives in: House/apartment Stairs: Yes: Internal: 15 steps; on right going up Has following equipment at home: Single point cane  OCCUPATION: works from home   PLOF: Independent  PATIENT GOALS: get rid of foot pain  NEXT MD VISIT: not scheduled  OBJECTIVE:  Note: Objective measures were completed at Evaluation unless otherwise noted.  DIAGNOSTIC FINDINGS: not available  PATIENT SURVEYS:  PSFS: THE PATIENT SPECIFIC FUNCTIONAL SCALE  Place score of 0-10 (0 = unable to perform activity and 10 = able to perform activity at the same level as before injury or problem)  Activity Date: 10/22/24    Initial standing and stepping 3    2.gait in community 3    3.standing for household tasks 3    4.      Total Score 9/3: 3      Total Score = Sum of activity scores/number of activities  Minimally Detectable Change: 3 points (for single activity); 2 points (for average score)  [facility] (nd). The Patient Specific Functional Scale . Retrieved from [email or web address]   COGNITION: Overall cognitive status: Within functional limits for tasks assessed     SENSATION: WFL  EDEMA:  Mild edema B ankles    POSTURE: hyperextension B knees  PALPATION: Very tender R calcaneus at attachment of pfascia, also along 1s 3 tendons extending to rays 1-3 Non tender with palpation of gastrocs,  soleus, tibialis anterior  LOWER EXTREMITY ROM:  Active ROM Right eval Left eval  Hip flexion    Hip extension    Hip abduction    Hip adduction    Hip internal rotation    Hip external rotation    Knee flexion    Knee extension    Ankle dorsiflexion 10   Ankle plantarflexion 21   Ankle inversion 5   Ankle eversion 5    (Blank rows = not tested)  LOWER EXTREMITY  MMT:  MMT Right eval Left eval  Hip flexion    Hip extension    Hip abduction    Hip adduction    Hip internal rotation    Hip external rotation    Knee flexion    Knee extension    Ankle dorsiflexion 4   Ankle plantarflexion 2+/5   Ankle inversion    Ankle eversion     (Blank rows = not tested)   FUNCTIONAL TESTS:  30 seconds chair stand test TBD 6 minute walk test: TBD  GAIT: Distance walked: in clinic Assistive device utilized: Single point cane Level of assistance: Modified independence and SBA Comments: using cane in R hand, advised trial in L for bette distribution of weight bearing R foot.  Antalgic gait on R  , diminished stance time                                                                                                                                TREATMENT DATE: 10/22/24 PT eval,  Prone for kinesiotaping 2 pieces extending from metatarsals to med and lat gastroc heads, also one piece extending from med calcaneus to navicular head. Education in possibly utilizing heel cups to center and support calcaneal fat pad  Also in lacing shoes to cinch around heel to provide better centering/ stability  Pt has orthotics that were provided for her prior to her subtalar fusion Inst in ankle pumps with yellow t band to begin strengthening plantarflexors R  PATIENT EDUCATION:  Education details: POC Person educated: Patient Education method: Explanation, Demonstration, Tactile cues, Verbal cues, and Handouts Education comprehension: verbalized understanding, returned demonstration, verbal cues required, and tactile cues required  HOME EXERCISE PROGRAM: Initiated R ankle pumps with yellow t band  ASSESSMENT:  CLINICAL IMPRESSION: Patient is a 58 y.o. female who was evaluated today by physical therapy for chronic R plantarfaciitis.  She had subtalar fusion 6 months ago and has some loss of movement R ankle particularly for inv and eversion.  She is pretty  sensitive along medial plantar fascia, also markedly weak R plantarflexors.  She has trialed massage, stretching, has orthotics that were made prior to her subtalar fusion.noted thinned fat pad R as well.  Utilized kinesiotape today to attempt to provide some support for R arch, and initiated progressive light strengthening plantarflexors R.  Hopefully she will benefit from progressive strengthening,  proprioceptive training, gait training, manual techniques as needed to  improve her R arch pain with physical therapy.  OBJECTIVE IMPAIRMENTS: decreased activity tolerance, decreased balance, decreased endurance, decreased mobility, difficulty walking, decreased ROM, decreased strength, hypomobility, increased fascial restrictions, impaired perceived functional ability, impaired flexibility, postural dysfunction, and pain.   ACTIVITY LIMITATIONS: carrying, lifting, bending, standing, squatting, stairs, transfers, bed mobility, and locomotion level  PARTICIPATION LIMITATIONS: meal prep, cleaning, shopping, community activity, and occupation  PERSONAL FACTORS: Behavior pattern, Past/current experiences, Time since onset of injury/illness/exacerbation, and 1-2 comorbidities: migraines, gastrectomy with 70# wt loss are also affecting patient's functional outcome.   REHAB POTENTIAL: Good  CLINICAL DECISION MAKING: Evolving/moderate complexity  EVALUATION COMPLEXITY: Moderate   GOALS: Goals reviewed with patient? Yes  SHORT TERM GOALS: Target date: 2 weeks, 11/06/23 I HEP Baseline: Goal status: INITIAL   LONG TERM GOALS: Target date: 8 weeks, 12/17/24  Improve strength R plantarflexion from 2+/5 to 4/5 Baseline:  Goal status: INITIAL  2.  Able to complete 6 min walk test without R foot pain Baseline: unable at eval, TBD Goal status: INITIAL  3.  Able to complete 30 sec sit to stand test with greater than 12 reps Baseline: unable at eval, TBD Goal status: INITIAL  4.  PSFS improve from 3 to  6 Baseline:  Goal status: INITIAL   PLAN:  PT FREQUENCY: 1-2x/week  PT DURATION: 8 weeks  PLANNED INTERVENTIONS: 97110-Therapeutic exercises, 97530- Therapeutic activity, 97112- Neuromuscular re-education, 97535- Self Care, [id number]- Manual therapy, Patient/Family education, and Taping  PLAN FOR NEXT SESSION: reassess ROM and strength R ankle and foot, soft tissue myofascial release R plantarflascia, navicular mobilizations   Stein Windhorst L Dallis Czaja, PT, DPT, OCS 10/22/2024, 12:57 PM
